# Patient Record
Sex: Female | Born: 1975 | Race: Black or African American | Hispanic: No | Marital: Single | Smoking: Current every day smoker
Health system: Southern US, Community
[De-identification: ages and names within clinical notes are randomized; demographics above are authoritative.]

## PROBLEM LIST (undated history)

## (undated) ENCOUNTER — Inpatient Hospital Stay: Admission: EM | Payer: Self-pay | Source: Home / Self Care

## (undated) DIAGNOSIS — Z789 Other specified health status: Secondary | ICD-10-CM

## (undated) DIAGNOSIS — R51 Headache: Secondary | ICD-10-CM

## (undated) HISTORY — PX: NO PAST SURGERIES: SHX2092

## (undated) HISTORY — DX: Headache: R51

---

## 1999-02-12 ENCOUNTER — Emergency Department (HOSPITAL_COMMUNITY): Admission: EM | Admit: 1999-02-12 | Discharge: 1999-02-12 | Payer: Self-pay | Admitting: Emergency Medicine

## 1999-02-16 DIAGNOSIS — R519 Headache, unspecified: Secondary | ICD-10-CM

## 1999-02-16 HISTORY — DX: Headache, unspecified: R51.9

## 1999-02-18 ENCOUNTER — Ambulatory Visit (HOSPITAL_COMMUNITY): Admission: RE | Admit: 1999-02-18 | Discharge: 1999-02-18 | Payer: Self-pay | Admitting: Emergency Medicine

## 1999-02-18 ENCOUNTER — Encounter: Payer: Self-pay | Admitting: Emergency Medicine

## 1999-04-08 ENCOUNTER — Emergency Department (HOSPITAL_COMMUNITY): Admission: EM | Admit: 1999-04-08 | Discharge: 1999-04-08 | Payer: Self-pay | Admitting: Emergency Medicine

## 1999-06-18 ENCOUNTER — Emergency Department (HOSPITAL_COMMUNITY): Admission: EM | Admit: 1999-06-18 | Discharge: 1999-06-18 | Payer: Self-pay | Admitting: Emergency Medicine

## 1999-11-04 ENCOUNTER — Encounter: Admission: RE | Admit: 1999-11-04 | Discharge: 1999-11-04 | Payer: Self-pay | Admitting: Family Medicine

## 1999-11-04 ENCOUNTER — Encounter: Payer: Self-pay | Admitting: Family Medicine

## 2000-06-17 ENCOUNTER — Encounter: Payer: Self-pay | Admitting: Emergency Medicine

## 2000-06-17 ENCOUNTER — Emergency Department (HOSPITAL_COMMUNITY): Admission: EM | Admit: 2000-06-17 | Discharge: 2000-06-17 | Payer: Self-pay | Admitting: Emergency Medicine

## 2001-07-24 ENCOUNTER — Emergency Department (HOSPITAL_COMMUNITY): Admission: EM | Admit: 2001-07-24 | Discharge: 2001-07-24 | Payer: Self-pay | Admitting: Emergency Medicine

## 2001-09-19 ENCOUNTER — Emergency Department (HOSPITAL_COMMUNITY): Admission: EM | Admit: 2001-09-19 | Discharge: 2001-09-19 | Payer: Self-pay | Admitting: Emergency Medicine

## 2001-12-22 ENCOUNTER — Inpatient Hospital Stay (HOSPITAL_COMMUNITY): Admission: AD | Admit: 2001-12-22 | Discharge: 2001-12-22 | Payer: Self-pay | Admitting: *Deleted

## 2002-08-15 ENCOUNTER — Emergency Department (HOSPITAL_COMMUNITY): Admission: EM | Admit: 2002-08-15 | Discharge: 2002-08-15 | Payer: Self-pay | Admitting: Emergency Medicine

## 2002-11-16 ENCOUNTER — Emergency Department (HOSPITAL_COMMUNITY): Admission: EM | Admit: 2002-11-16 | Discharge: 2002-11-16 | Payer: Self-pay | Admitting: Emergency Medicine

## 2004-01-17 ENCOUNTER — Emergency Department (HOSPITAL_COMMUNITY): Admission: EM | Admit: 2004-01-17 | Discharge: 2004-01-18 | Payer: Self-pay

## 2004-02-29 ENCOUNTER — Emergency Department (HOSPITAL_COMMUNITY): Admission: EM | Admit: 2004-02-29 | Discharge: 2004-03-01 | Payer: Self-pay | Admitting: Emergency Medicine

## 2004-07-07 ENCOUNTER — Emergency Department (HOSPITAL_COMMUNITY): Admission: EM | Admit: 2004-07-07 | Discharge: 2004-07-07 | Payer: Self-pay | Admitting: Emergency Medicine

## 2005-04-24 ENCOUNTER — Emergency Department (HOSPITAL_COMMUNITY): Admission: EM | Admit: 2005-04-24 | Discharge: 2005-04-24 | Payer: Self-pay | Admitting: Emergency Medicine

## 2005-07-19 ENCOUNTER — Emergency Department (HOSPITAL_COMMUNITY): Admission: EM | Admit: 2005-07-19 | Discharge: 2005-07-19 | Payer: Self-pay | Admitting: Emergency Medicine

## 2006-01-04 ENCOUNTER — Emergency Department (HOSPITAL_COMMUNITY): Admission: EM | Admit: 2006-01-04 | Discharge: 2006-01-04 | Payer: Self-pay | Admitting: Emergency Medicine

## 2006-07-31 ENCOUNTER — Emergency Department (HOSPITAL_COMMUNITY): Admission: EM | Admit: 2006-07-31 | Discharge: 2006-07-31 | Payer: Self-pay | Admitting: Emergency Medicine

## 2006-08-25 ENCOUNTER — Emergency Department (HOSPITAL_COMMUNITY): Admission: EM | Admit: 2006-08-25 | Discharge: 2006-08-25 | Payer: Self-pay | Admitting: Emergency Medicine

## 2006-12-01 ENCOUNTER — Emergency Department (HOSPITAL_COMMUNITY): Admission: EM | Admit: 2006-12-01 | Discharge: 2006-12-01 | Payer: Self-pay | Admitting: Emergency Medicine

## 2010-04-15 ENCOUNTER — Emergency Department (HOSPITAL_COMMUNITY)
Admission: EM | Admit: 2010-04-15 | Discharge: 2010-04-15 | Disposition: A | Discharge status: Home / Self Care | Payer: Self-pay | Attending: Emergency Medicine | Admitting: Emergency Medicine

## 2010-04-15 DIAGNOSIS — G5600 Carpal tunnel syndrome, unspecified upper limb: Secondary | ICD-10-CM | POA: Insufficient documentation

## 2010-04-15 DIAGNOSIS — R209 Unspecified disturbances of skin sensation: Secondary | ICD-10-CM | POA: Insufficient documentation

## 2010-04-15 DIAGNOSIS — M25539 Pain in unspecified wrist: Secondary | ICD-10-CM | POA: Insufficient documentation

## 2010-12-19 ENCOUNTER — Encounter (HOSPITAL_COMMUNITY): Payer: Self-pay | Admitting: Obstetrics and Gynecology

## 2010-12-19 ENCOUNTER — Inpatient Hospital Stay (HOSPITAL_COMMUNITY)
Admission: AD | Admit: 2010-12-19 | Discharge: 2010-12-19 | Disposition: A | Discharge status: Home / Self Care | Payer: Self-pay | Source: Ambulatory Visit | Attending: Family Medicine | Admitting: Family Medicine

## 2010-12-19 DIAGNOSIS — R110 Nausea: Secondary | ICD-10-CM | POA: Insufficient documentation

## 2010-12-19 DIAGNOSIS — R109 Unspecified abdominal pain: Secondary | ICD-10-CM | POA: Insufficient documentation

## 2010-12-19 HISTORY — DX: Other specified health status: Z78.9

## 2010-12-19 LAB — URINALYSIS, ROUTINE W REFLEX MICROSCOPIC
Glucose, UA: NEGATIVE mg/dL
Hgb urine dipstick: NEGATIVE
Ketones, ur: 15 mg/dL — AB
Protein, ur: NEGATIVE mg/dL
Urobilinogen, UA: 0.2 mg/dL (ref 0.0–1.0)

## 2010-12-19 LAB — WET PREP, GENITAL
Clue Cells Wet Prep HPF POC: NONE SEEN
Trich, Wet Prep: NONE SEEN
Yeast Wet Prep HPF POC: NONE SEEN

## 2010-12-19 NOTE — ED Provider Notes (Signed)
History     CSN: [id number] Arrival date & time: 12/19/2010  4:03 PM   None     Chief Complaint  Patient presents with  . Nausea  . Dizziness  . Exposure to STD     HPI Teresa Cain is a 35 y.o. female who presents to [facility] for lower abdominal cramping. She is afraid she has trichomonas because she had it before and felt the same way. Current sex partner x 3 months, unprotected sex.  No birth control. Periods have been a little irregular for the past few months.   Past Medical History  Diagnosis Date  . No pertinent past medical history     Past Surgical History  Procedure Date  . No past surgeries     No family history on file.  History  Substance Use Topics  . Smoking status: Current Some Day Smoker -- 1.0 packs/day  . Smokeless tobacco: Not on file  . Alcohol Use: No    OB History    Grav Para Term Preterm Abortions TAB SAB Ect Mult Living   0 0 0 0 0 0 0 0 0 0       Review of Systems  Constitutional: Positive for chills and appetite change. Negative for fever, diaphoresis and fatigue.  HENT: Negative for ear pain, congestion, sore throat, facial swelling, neck pain, neck stiffness, dental problem and sinus pressure.   Eyes: Positive for visual disturbance. Negative for photophobia, pain and discharge.  Respiratory: Negative for cough, chest tightness and wheezing.   Cardiovascular: Negative.   Gastrointestinal: Positive for nausea and abdominal pain. Negative for vomiting, diarrhea, constipation and abdominal distention.  Genitourinary: Positive for urgency, frequency and vaginal discharge. Negative for dysuria, flank pain, vaginal bleeding and difficulty urinating.  Musculoskeletal: Positive for back pain. Negative for myalgias and gait problem.  Skin: Negative for color change and rash.  Neurological: Positive for light-headedness and headaches. Negative for dizziness, speech difficulty, weakness and numbness.  Psychiatric/Behavioral: Negative for  confusion and agitation.    Allergies  Review of patient's allergies indicates no known allergies.  Home Medications  No current outpatient prescriptions on file.  BP 111/59  Pulse 81  Temp(Src) 98.8 F (37.1 C) (Oral)  Resp 18  Ht 5\' 1"  (1.549 m)  Wt 136 lb 9.6 oz (61.961 kg)  BMI 25.81 kg/m2  LMP 12/12/2010  Physical Exam  Nursing note and vitals reviewed. Constitutional: She is oriented to person, place, and time. She appears well-developed and well-nourished. No distress.  HENT:  Head: Normocephalic.  Eyes: EOM are normal.  Neck: Neck supple.  Cardiovascular: Normal rate.   Pulmonary/Chest: Effort normal.  Abdominal: Soft. There is no tenderness.       Unable to reproduce the pain.  Genitourinary:       External without lesions. Mucous discharge vaginal vault. Cervix without lesions, no CMT, no adnexal tenderness.   Musculoskeletal: Normal range of motion.  Neurological: She is alert and oriented to person, place, and time. No cranial nerve deficit.  Skin: Skin is warm and dry.  Psychiatric: She has a normal mood and affect. Her behavior is normal. Judgment and thought content normal.   Results for orders placed during the hospital encounter of 12/19/10 (from the past 24 hour(s))  URINALYSIS, ROUTINE W REFLEX MICROSCOPIC     Status: Abnormal   Collection Time   12/19/10  4:35 PM      Component Value Range   Color, Urine YELLOW  YELLOW    Appearance CLEAR  CLEAR    Specific Gravity, Urine 1.025  1.005 - 1.030    pH 6.0  5.0 - 8.0    Glucose, UA NEGATIVE  NEGATIVE (mg/dL)   Hgb urine dipstick NEGATIVE  NEGATIVE    Bilirubin Urine NEGATIVE  NEGATIVE    Ketones, ur 15 (*) NEGATIVE (mg/dL)   Protein, ur NEGATIVE  NEGATIVE (mg/dL)   Urobilinogen, UA 0.2  0.0 - 1.0 (mg/dL)   Nitrite NEGATIVE  NEGATIVE    Leukocytes, UA NEGATIVE  NEGATIVE   POCT PREGNANCY, URINE     Status: Normal   Collection Time   12/19/10  4:46 PM      Component Value Range   Preg Test, Ur  NEGATIVE    WET PREP, GENITAL     Status: Abnormal   Collection Time   12/19/10  6:15 PM      Component Value Range   Yeast, Wet Prep NONE SEEN  NONE SEEN    Trich, Wet Prep NONE SEEN  NONE SEEN    Clue Cells, Wet Prep NONE SEEN  NONE SEEN    WBC, Wet Prep HPF POC FEW (*) NONE SEEN    Assessment: Nausea   Lower abdominal cramping  Plan:  B.R.A.T diet   Discussed if develops persistent vomiting, high fever or other problems to return.   Follow up with [facility]  Note: Patient states she feels better, just glad to know she does not have trichomonas and is not pregnant.  ED Course  Procedures   MDM          Kerrie Buffalo, NP 12/19/10 1850

## 2010-12-19 NOTE — Progress Notes (Addendum)
Pt reports feeling nauseated  Dizzy and headache x 3 days. Poor appititie vomited yesterday. Pt thinks she may have an STD because she is feeling sick.

## 2010-12-19 NOTE — Progress Notes (Signed)
Pt presents to [facility] with chief complaint of lower abdominal cramping and possible exposure to and STI. Pt requests STI check at this time. Pt is G0

## 2010-12-19 NOTE — ED Provider Notes (Signed)
Chart reviewed and agree with management and plan.  

## 2011-03-13 ENCOUNTER — Encounter (HOSPITAL_COMMUNITY): Payer: Self-pay | Admitting: Emergency Medicine

## 2011-03-13 ENCOUNTER — Emergency Department (HOSPITAL_COMMUNITY)
Admission: EM | Admit: 2011-03-13 | Discharge: 2011-03-13 | Disposition: A | Discharge status: Home / Self Care | Payer: Self-pay | Attending: Emergency Medicine | Admitting: Emergency Medicine

## 2011-03-13 DIAGNOSIS — R220 Localized swelling, mass and lump, head: Secondary | ICD-10-CM | POA: Insufficient documentation

## 2011-03-13 DIAGNOSIS — K047 Periapical abscess without sinus: Secondary | ICD-10-CM | POA: Insufficient documentation

## 2011-03-13 DIAGNOSIS — K089 Disorder of teeth and supporting structures, unspecified: Secondary | ICD-10-CM | POA: Insufficient documentation

## 2011-03-13 DIAGNOSIS — K029 Dental caries, unspecified: Secondary | ICD-10-CM | POA: Insufficient documentation

## 2011-03-13 DIAGNOSIS — R221 Localized swelling, mass and lump, neck: Secondary | ICD-10-CM | POA: Insufficient documentation

## 2011-03-13 DIAGNOSIS — K0889 Other specified disorders of teeth and supporting structures: Secondary | ICD-10-CM

## 2011-03-13 MED ORDER — AMOXICILLIN 500 MG PO CAPS: 500.0000 mg | ORAL_CAPSULE | Freq: Three times a day (TID) | ORAL | Status: AC

## 2011-03-13 MED ORDER — HYDROCODONE-ACETAMINOPHEN 5-500 MG PO TABS: 1.0000 | ORAL_TABLET | Freq: Four times a day (QID) | ORAL | Status: AC | PRN

## 2011-03-13 NOTE — ED Provider Notes (Signed)
History     CSN: [id number]  Arrival date & time 03/13/11  1437   First MD Initiated Contact with Patient 03/13/11 1546      Chief Complaint  Patient presents with  . Dental Pain    (Consider location/radiation/quality/duration/timing/severity/associated sxs/prior treatment) Patient is a 36 y.o. female presenting with tooth pain. The history is provided by the patient.  Dental PainPrimary symptoms do not include headaches, fever or sore throat.  Additional symptoms do not include: trouble swallowing.  pt c/o right lower dental pain, for past 2 weeks. No injury. No local dentist. Constant, dull, severe, non radiating pain worse w eating. No neck pain or stiffness. No facial swelling or redness. No fever or chills.   Past Medical History  Diagnosis Date  . No pertinent past medical history     Past Surgical History  Procedure Date  . No past surgeries     No family history on file.  History  Substance Use Topics  . Smoking status: Current Some Day Smoker -- 1.0 packs/day  . Smokeless tobacco: Not on file  . Alcohol Use: No    OB History    Grav Para Term Preterm Abortions TAB SAB Ect Mult Living   0 0 0 0 0 0 0 0 0 0       Review of Systems  Constitutional: Negative for fever and chills.  HENT: Negative for sore throat and trouble swallowing.   Skin: Negative for rash.  Neurological: Negative for headaches.    Allergies  Review of patient's allergies indicates no known allergies.  Home Medications   Current Outpatient Rx  Name Route Sig Dispense Refill  . IBUPROFEN 200 MG PO TABS Oral Take 200 mg by mouth every 6 (six) hours as needed. For toothache      BP 115/64  Pulse 86  Temp(Src) 98 F (36.7 C) (Oral)  Resp 18  SpO2 100%  LMP 03/07/2011  Physical Exam  Nursing note and vitals reviewed. Constitutional: She appears well-developed and well-nourished. No distress.  HENT:  Head: Atraumatic.  Mouth/Throat: Oropharynx is clear and moist.   Right lower dental decay. Gum swelling and tenderness. No trismus. No sts or tenderness to floor of mouth. No facial redness/swelling.   Eyes: Conjunctivae are normal. No scleral icterus.  Neck: Normal range of motion. Neck supple. No tracheal deviation present.  Cardiovascular: Normal rate.   Pulmonary/Chest: Effort normal. No respiratory distress.  Abdominal: Normal appearance. She exhibits no distension.  Musculoskeletal: She exhibits no edema.  Lymphadenopathy:    She has no cervical adenopathy.  Neurological: She is alert.  Skin: Skin is warm and dry. No rash noted.  Psychiatric: She has a normal mood and affect.    ED Course  Procedures (including critical care time)     MDM  Confirmed nkda. Discussed importance close dental follow up.         Suzi Roots, MD 03/13/11 [phone]

## 2011-03-13 NOTE — ED Notes (Signed)
Pt. Stated, I started having a bad toothache yesteday

## 2012-02-21 ENCOUNTER — Emergency Department (INDEPENDENT_AMBULATORY_CARE_PROVIDER_SITE_OTHER)
Admission: EM | Admit: 2012-02-21 | Discharge: 2012-02-21 | Disposition: A | Discharge status: Home / Self Care | Payer: Self-pay | Source: Home / Self Care | Attending: Family Medicine | Admitting: Family Medicine

## 2012-02-21 ENCOUNTER — Encounter (HOSPITAL_COMMUNITY): Payer: Self-pay

## 2012-02-21 DIAGNOSIS — R109 Unspecified abdominal pain: Secondary | ICD-10-CM

## 2012-02-21 DIAGNOSIS — N644 Mastodynia: Secondary | ICD-10-CM

## 2012-02-21 LAB — POCT URINALYSIS DIP (DEVICE)
Glucose, UA: NEGATIVE mg/dL
Leukocytes, UA: NEGATIVE
Nitrite: NEGATIVE
Urobilinogen, UA: 0.2 mg/dL (ref 0.0–1.0)

## 2012-02-21 LAB — POCT PREGNANCY, URINE: Preg Test, Ur: NEGATIVE

## 2012-02-21 NOTE — ED Provider Notes (Signed)
History     CSN: [id number]  Arrival date & time 02/21/12  1243   First MD Initiated Contact with Patient 02/21/12 1434      Chief Complaint  Patient presents with  . Abdominal Pain    (Consider location/radiation/quality/duration/timing/severity/associated sxs/prior treatment) Patient is a 37 y.o. female presenting with chest pain and abdominal pain. The history is provided by the patient.  Chest Pain The chest pain began 5 - 7 days ago. Chest pain occurs constantly. The severity of the pain is moderate. The quality of the pain is described as aching. The pain does not radiate. Exacerbated by: pressure. Primary symptoms include abdominal pain. Pertinent negatives for primary symptoms include no nausea and no vomiting. She tried nothing for the symptoms. There are no known risk factors. Family history comments: fibrocystic breast    Abdominal Pain The primary symptoms of the illness include abdominal pain. The primary symptoms of the illness do not include nausea, vomiting, diarrhea, dysuria, vaginal discharge or vaginal bleeding. The current episode started 2 days ago. The onset of the illness was gradual.  The abdominal pain is located in the periumbilical region. The abdominal pain does not radiate. The abdominal pain is relieved by nothing.  The patient states that she believes she is currently not pregnant. The patient has not had a change in bowel habit. Symptoms associated with the illness do not include chills, anorexia, heartburn, constipation, urgency, frequency or back pain.    Past Medical History  Diagnosis Date  . No pertinent past medical history     Past Surgical History  Procedure Date  . No past surgeries     History reviewed. No pertinent family history.  History  Substance Use Topics  . Smoking status: Current Some Day Smoker -- 1.0 packs/day  . Smokeless tobacco: Not on file  . Alcohol Use: No    OB History    Grav Para Term Preterm Abortions TAB SAB  Ect Mult Living   0 0 0 0 0 0 0 0 0 0       Review of Systems  Constitutional: Negative for chills.  Cardiovascular: Positive for chest pain.  Gastrointestinal: Positive for abdominal pain. Negative for heartburn, nausea, vomiting, diarrhea, constipation and anorexia.  Genitourinary: Negative for dysuria, urgency, frequency, vaginal bleeding and vaginal discharge.  Musculoskeletal: Negative for back pain.  All other systems reviewed and are negative.    Allergies  Review of patient's allergies indicates no known allergies.  Home Medications   Current Outpatient Rx  Name  Route  Sig  Dispense  Refill  . IBUPROFEN 200 MG PO TABS   Oral   Take 200 mg by mouth every 6 (six) hours as needed. For toothache           BP 116/68  Pulse 74  Temp 98.7 F (37.1 C) (Oral)  Resp 16  SpO2 98%  LMP 01/31/2012  Physical Exam  Nursing note and vitals reviewed. Constitutional: She is oriented to person, place, and time. Vital signs are normal. She appears well-developed and well-nourished. She is active and cooperative.  HENT:  Head: Normocephalic.  Eyes: Conjunctivae normal are normal. Pupils are equal, round, and reactive to light. No scleral icterus.  Neck: Trachea normal. Neck supple.  Cardiovascular: Normal rate, regular rhythm, normal heart sounds and intact distal pulses.   Pulmonary/Chest: Effort normal and breath sounds normal. Right breast exhibits tenderness. Right breast exhibits no inverted nipple, no mass, no nipple discharge and no skin change. Left breast exhibits  tenderness. Left breast exhibits no inverted nipple, no mass, no nipple discharge and no skin change. Breasts are symmetrical.  Abdominal: Normal appearance and bowel sounds are normal. There is no tenderness. There is no CVA tenderness.  Lymphadenopathy:    She has no axillary adenopathy.       Right: No inguinal adenopathy present.       Left: No inguinal adenopathy present.  Neurological: She is alert and  oriented to person, place, and time. No cranial nerve deficit or sensory deficit.  Skin: Skin is warm and dry.  Psychiatric: She has a normal mood and affect. Her speech is normal and behavior is normal. Judgment and thought content normal. Cognition and memory are normal.    ED Course  Procedures (including critical care time)   Labs Reviewed  POCT URINALYSIS DIP (DEVICE)  POCT PREGNANCY, URINE   No results found.   1. Breast tenderness in female   2. Abdominal  pain, other specified site       MDM  Ibuprofen for breast pain, no masses palpated.  Increase fluids, [facility]          Johnsie Kindred, NP 02/21/12 1444  Johnsie Kindred, NP 02/21/12 1446

## 2012-02-21 NOTE — ED Notes (Signed)
Breast pain, pain in chest, HA, abdominal pain ; sexually active w/o BC

## 2012-02-22 NOTE — ED Provider Notes (Signed)
Medical screening examination/treatment/procedure(s) were performed by resident physician or non-physician practitioner and as supervising physician I was immediately available for consultation/collaboration.   KINDL,JAMES DOUGLAS MD.    James D Kindl, MD 02/22/12 1808 

## 2012-05-21 ENCOUNTER — Encounter (HOSPITAL_COMMUNITY): Payer: Self-pay | Admitting: Emergency Medicine

## 2012-05-21 ENCOUNTER — Emergency Department (HOSPITAL_COMMUNITY)
Admission: EM | Admit: 2012-05-21 | Discharge: 2012-05-21 | Disposition: A | Discharge status: Home / Self Care | Payer: Self-pay | Attending: Emergency Medicine | Admitting: Emergency Medicine

## 2012-05-21 DIAGNOSIS — F17200 Nicotine dependence, unspecified, uncomplicated: Secondary | ICD-10-CM | POA: Insufficient documentation

## 2012-05-21 DIAGNOSIS — L0291 Cutaneous abscess, unspecified: Secondary | ICD-10-CM

## 2012-05-21 DIAGNOSIS — IMO0002 Reserved for concepts with insufficient information to code with codable children: Secondary | ICD-10-CM | POA: Insufficient documentation

## 2012-05-21 MED ORDER — SULFAMETHOXAZOLE-TMP DS 800-160 MG PO TABS: 1.0000 | ORAL_TABLET | Freq: Two times a day (BID) | ORAL | Status: DC

## 2012-05-21 MED ORDER — CEPHALEXIN 500 MG PO CAPS: 500.0000 mg | ORAL_CAPSULE | Freq: Two times a day (BID) | ORAL | Status: DC

## 2012-05-21 NOTE — ED Notes (Signed)
Patient is alert and orientedx4.  Patient was explained discharge instructions and they understood them with no questions.   

## 2012-05-21 NOTE — ED Provider Notes (Signed)
History    This chart was scribed for non-physician practitioner working with Vida Roller, MD by Sofie Rower, ED Scribe. This patient was seen in room TR07C/TR07C and the patient's care was started at 6:07PM.   CSN: [id number]  Arrival date & time 05/21/12  1753   First MD Initiated Contact with Patient 05/21/12 1807      Chief Complaint  Patient presents with  . Abscess    (Consider location/radiation/quality/duration/timing/severity/associated sxs/prior treatment) Patient is a 37 y.o. female presenting with abscess. The history is provided by the patient. No language interpreter was used.  Abscess Location:  Shoulder/arm Shoulder/arm abscess location:  L shoulder Abscess quality: painful   Duration:  3 days Progression:  Worsening Pain details:    Quality:  Unable to specify   Severity:  Moderate   Duration:  3 days   Timing:  Constant   Progression:  Worsening Chronicity:  New Relieved by:  Nothing Worsened by:  Nothing tried Ineffective treatments:  Draining/squeezing Associated symptoms: no anorexia, no fatigue, no fever, no headaches, no nausea and no vomiting   Risk factors: no hx of MRSA and no prior abscess     Teresa Cain is a 37 y.o. female , with a hx of cardiac valce replacement, who presents to the Emergency Department complaining of gradual, progressively worsening, abscess, located at the left superior scapular area onset three days ago (05/18/12).  Associated symptoms include chills and swelling located at the left superior scapular area . The pt reports she popped what she believed to be a pimple located at her left superior scapular area, three days ago. The pt informs that the location of the pimple has become progressively more swollen and painful over the past three days, prompting her concern and desire to seek medical evaluation at [facility] this evening.  The pt denies experiencing any fever with regards to the abscess.   The pt is a current some day  smoker, however, she does not drink alcohol.     Past Medical History  Diagnosis Date  . No pertinent past medical history     Past Surgical History  Procedure Laterality Date  . No past surgeries      History reviewed. No pertinent family history.  History  Substance Use Topics  . Smoking status: Current Some Day Smoker -- 1.00 packs/day  . Smokeless tobacco: Not on file  . Alcohol Use: No    OB History   Grav Para Term Preterm Abortions TAB SAB Ect Mult Living   0 0 0 0 0 0 0 0 0 0       Review of Systems  Constitutional: Positive for chills. Negative for fever and fatigue.  Gastrointestinal: Negative for nausea, vomiting and anorexia.  Skin: Positive for wound.  Neurological: Negative for headaches.    Allergies  Review of patient's allergies indicates no known allergies.  Home Medications   Current Outpatient Rx  Name  Route  Sig  Dispense  Refill  . acetaminophen (TYLENOL) 500 MG tablet   Oral   Take 1,000 mg by mouth every 6 (six) hours as needed for pain.         Marland Kitchen ibuprofen (ADVIL,MOTRIN) 200 MG tablet   Oral   Take 400 mg by mouth every 6 (six) hours as needed for pain. For toothache           BP 118/74  Pulse 101  Temp(Src) 98.2 F (36.8 C) (Oral)  Resp 20  SpO2 100%  LMP  04/24/2012  Physical Exam  Nursing note and vitals reviewed. Constitutional: She is oriented to person, place, and time. She appears well-developed and well-nourished. No distress.  HENT:  Head: Normocephalic and atraumatic.  Eyes: EOM are normal.  Neck: Neck supple. No tracheal deviation present.  Cardiovascular: Normal rate.   Pulmonary/Chest: Effort normal. No respiratory distress.  Musculoskeletal: Normal range of motion.  Neurological: She is alert and oriented to person, place, and time.  Skin: Skin is warm and dry. Lesion noted.     Left superior scapular area : Induration area 7 X 6 cm. Deep central fluctuance with overlying induration  Psychiatric: She  has a normal mood and affect. Her behavior is normal.    ED Course  Procedures (including critical care time)  DIAGNOSTIC STUDIES: Oxygen Saturation is 100% on room air, normal by my interpretation.    COORDINATION OF CARE:   7:37 PM- Treatment plan concerning incision and drainage of abscess discussed with patient. Pt agrees with treatment.  INCISION AND DRAINAGE PROCEDURE NOTE: Patient identification was confirmed and verbal consent was obtained. This procedure was performed by Cleda Mccreedy (PA) at 7:42 PM. Site: posterior Left superior scapular area Sterile procedures observed YES Needle size: 27 GAUGE MONOJECT Anesthetic used (type and amt): 2% LIDOCAINE WITH EPI Blade size: 11 Drainage: COPIOUS Complexity: SIMPLE Packing usedNONE Site anesthetized, incision made over site, wound drained and explored loculations, rinsed with copious amounts of normal saline, wound packed with sterile gauze, covered with dry, sterile dressing.  Pt tolerated procedure well without complications.  Instructions for care discussed verbally and pt provided with additional written instructions for homecare and f/u.  7:56 PM- Treatment plan concerning application of antibiotics discussed with patient. Pt agrees with treatment.      Labs Reviewed - No data to display No results found.   1. Abscess       MDM  Patient with skin abscess amenable to incision and drainage.  Abscess was not large enough to warrant packing.  Will d/c to home. DC with keflex and bactrim.       I personally performed the services described in this documentation, which was scribed in my presence. The recorded information has been reviewed and is accurate.    Arthor Captain, PA-C 05/25/12 1247

## 2012-05-21 NOTE — ED Notes (Signed)
Patient said that three days ago she felt a bump on her back.  The patient said she tried to "pop" it and that's when it started getting worse.  The patient said the pain is so severe she decided to come to the ED.

## 2012-05-25 NOTE — ED Provider Notes (Signed)
Medical screening examination/treatment/procedure(s) were performed by non-physician practitioner and as supervising physician I was immediately available for consultation/collaboration.    Henryk Ursin D Lowen Barringer, MD 05/25/12 1643 

## 2013-02-03 ENCOUNTER — Emergency Department (HOSPITAL_COMMUNITY)
Admission: EM | Admit: 2013-02-03 | Discharge: 2013-02-03 | Disposition: A | Discharge status: Home / Self Care | Payer: Medicaid Other | Attending: Emergency Medicine | Admitting: Emergency Medicine

## 2013-02-03 ENCOUNTER — Encounter (HOSPITAL_COMMUNITY): Payer: Self-pay | Admitting: Emergency Medicine

## 2013-02-03 DIAGNOSIS — R110 Nausea: Secondary | ICD-10-CM | POA: Insufficient documentation

## 2013-02-03 DIAGNOSIS — F17200 Nicotine dependence, unspecified, uncomplicated: Secondary | ICD-10-CM | POA: Insufficient documentation

## 2013-02-03 DIAGNOSIS — A5900 Urogenital trichomoniasis, unspecified: Secondary | ICD-10-CM | POA: Insufficient documentation

## 2013-02-03 DIAGNOSIS — A64 Unspecified sexually transmitted disease: Secondary | ICD-10-CM | POA: Insufficient documentation

## 2013-02-03 DIAGNOSIS — Z3202 Encounter for pregnancy test, result negative: Secondary | ICD-10-CM | POA: Insufficient documentation

## 2013-02-03 DIAGNOSIS — A599 Trichomoniasis, unspecified: Secondary | ICD-10-CM

## 2013-02-03 LAB — URINALYSIS, ROUTINE W REFLEX MICROSCOPIC
Glucose, UA: NEGATIVE mg/dL
Ketones, ur: NEGATIVE mg/dL
Leukocytes, UA: NEGATIVE
Nitrite: NEGATIVE
Protein, ur: NEGATIVE mg/dL
Urobilinogen, UA: 0.2 mg/dL (ref 0.0–1.0)

## 2013-02-03 LAB — HIV ANTIBODY (ROUTINE TESTING W REFLEX): HIV: NONREACTIVE

## 2013-02-03 LAB — WET PREP, GENITAL: Yeast Wet Prep HPF POC: NONE SEEN

## 2013-02-03 MED ORDER — LIDOCAINE HCL (PF) 1 % IJ SOLN
INTRAMUSCULAR | Status: AC
  Filled 2013-02-03: qty 5

## 2013-02-03 MED ORDER — CEFTRIAXONE SODIUM 250 MG IJ SOLR
250.0000 mg | Freq: Once | INTRAMUSCULAR | Status: AC
  Administered 2013-02-03: 250 mg via INTRAMUSCULAR
  Filled 2013-02-03: qty 250

## 2013-02-03 MED ORDER — AZITHROMYCIN 250 MG PO TABS
1000.0000 mg | ORAL_TABLET | Freq: Once | ORAL | Status: AC
  Administered 2013-02-03: 1000 mg via ORAL
  Filled 2013-02-03: qty 4

## 2013-02-03 MED ORDER — METRONIDAZOLE 500 MG PO TABS: 500.0000 mg | ORAL_TABLET | Freq: Two times a day (BID) | ORAL | Status: DC

## 2013-02-03 NOTE — ED Provider Notes (Addendum)
CSN: [id number]     Arrival date & time 02/03/13  0902 History   First MD Initiated Contact with Patient 02/03/13 [phone]     Chief Complaint  Patient presents with  . Abdominal Pain  . Urinary Frequency  . Pelvic Pain   (Consider location/radiation/quality/duration/timing/severity/associated sxs/prior Treatment) Patient is a 37 y.o. female presenting with abdominal pain, frequency, and pelvic pain. The history is provided by the patient.  Abdominal Pain Pain location:  Suprapubic Pain quality: cramping   Pain radiates to:  Does not radiate Pain severity:  Moderate Onset quality:  Gradual Duration:  1 week Timing:  Constant Progression:  Worsening Chronicity:  New Context: recent sexual activity   Relieved by:  None tried Worsened by:  Urination Associated symptoms: dysuria, nausea and vaginal discharge   Associated symptoms: no chills, no diarrhea, no fever, no vaginal bleeding and no vomiting   Risk factors comment:  Prior hx of std about 1 year ago Urinary Frequency Associated symptoms include abdominal pain.  Pelvic Pain Associated symptoms include abdominal pain.    Past Medical History  Diagnosis Date  . No pertinent past medical history    Past Surgical History  Procedure Laterality Date  . No past surgeries     History reviewed. No pertinent family history. History  Substance Use Topics  . Smoking status: Current Some Day Smoker -- 1.00 packs/day  . Smokeless tobacco: Not on file  . Alcohol Use: No   OB History   Grav Para Term Preterm Abortions TAB SAB Ect Mult Living   0 0 0 0 0 0 0 0 0 0      Review of Systems  Constitutional: Negative for fever and chills.  Gastrointestinal: Positive for nausea and abdominal pain. Negative for vomiting and diarrhea.  Genitourinary: Positive for dysuria, frequency, vaginal discharge and pelvic pain. Negative for vaginal bleeding.  All other systems reviewed and are negative.    Allergies  Review of patient's  allergies indicates no known allergies.  Home Medications   Current Outpatient Rx  Name  Route  Sig  Dispense  Refill  . acetaminophen (TYLENOL) 500 MG tablet   Oral   Take 1,000 mg by mouth every 6 (six) hours as needed for pain.         Marland Kitchen ibuprofen (ADVIL,MOTRIN) 200 MG tablet   Oral   Take 400 mg by mouth every 6 (six) hours as needed for pain. For toothache          BP 111/69  Pulse 83  Temp(Src) 99.2 F (37.3 C) (Oral)  Resp 14  Ht 5\' 1"  (1.549 m)  Wt 130 lb (58.968 kg)  BMI 24.58 kg/m2  SpO2 99% Physical Exam  Nursing note and vitals reviewed. Constitutional: She is oriented to person, place, and time. She appears well-developed and well-nourished. No distress.  HENT:  Head: Normocephalic and atraumatic.  Mouth/Throat: Oropharynx is clear and moist.  Eyes: Conjunctivae and EOM are normal. Pupils are equal, round, and reactive to light.  Neck: Normal range of motion. Neck supple.  Cardiovascular: Normal rate, regular rhythm and intact distal pulses.   No murmur heard. Pulmonary/Chest: Effort normal and breath sounds normal. No respiratory distress. She has no wheezes. She has no rales.  Abdominal: Soft. She exhibits no distension. There is tenderness in the suprapubic area. There is no rebound and no guarding.  Mild bilateral flank tenderness  Genitourinary: Uterus normal. Cervix exhibits discharge. Cervix exhibits no motion tenderness and no friability. Right adnexum displays no  mass and no tenderness. Left adnexum displays no mass and no tenderness. No tenderness or bleeding around the vagina. Vaginal discharge found.  Musculoskeletal: Normal range of motion. She exhibits no edema and no tenderness.  Neurological: She is alert and oriented to person, place, and time.  Skin: Skin is warm and dry. No rash noted. No erythema.  Psychiatric: She has a normal mood and affect. Her behavior is normal.    ED Course  Procedures (including critical care time) Labs  Review Labs Reviewed  WET PREP, GENITAL - Abnormal; Notable for the following:    Trich, Wet Prep MANY (*)    Clue Cells Wet Prep HPF POC FEW (*)    WBC, Wet Prep HPF POC FEW (*)    All other components within normal limits  GC/CHLAMYDIA PROBE AMP  URINALYSIS, ROUTINE W REFLEX MICROSCOPIC  RPR  HIV ANTIBODY (ROUTINE TESTING)  POCT PREGNANCY, URINE   Imaging Review No results found.  EKG Interpretation   None       MDM   1. Trichomonas   2. Sexually transmitted disease    Patient here with pelvic cramping, vaginal discharge and dysuria for the last one week. She is sexually active and does not use protection. She is sexually active with one partner and is not aware of her partner having any symptoms at this time. Last menstrual period was earlier this week and was normal. UPT is negative.  Patient currently has no symptoms consistent with pyelonephritis however concern for UTI versus STD.  STD panel including syphilis and HIV sent.  11:26 AM Wet prep with trichomonas.  Pt treated with rocephin, azithro and flagyl.  Gwyneth Sprout, MD 02/03/13 1126  Gwyneth Sprout, MD 02/03/13 [phone]

## 2013-02-03 NOTE — ED Notes (Signed)
PT ambulated with baseline gait; VSS; A&Ox3; no signs of distress; respirations even and unlabored; skin warm and dry; no questions upon discharge.  

## 2013-02-03 NOTE — ED Notes (Signed)
Pt here for abd pain and urinary symptoms, reports abd cramping and pelvic pain, pt reporst she is sexually active. Patient sts she has discharge when she urinates

## 2013-02-05 LAB — GC/CHLAMYDIA PROBE AMP: GC Probe RNA: NEGATIVE

## 2013-06-11 ENCOUNTER — Emergency Department (HOSPITAL_COMMUNITY)
Admission: EM | Admit: 2013-06-11 | Discharge: 2013-06-11 | Disposition: A | Discharge status: Home / Self Care | Payer: Medicaid Other | Attending: Emergency Medicine | Admitting: Emergency Medicine

## 2013-06-11 ENCOUNTER — Emergency Department (HOSPITAL_COMMUNITY): Payer: Medicaid Other

## 2013-06-11 ENCOUNTER — Encounter (HOSPITAL_COMMUNITY): Payer: Self-pay | Admitting: Emergency Medicine

## 2013-06-11 DIAGNOSIS — Z331 Pregnant state, incidental: Secondary | ICD-10-CM

## 2013-06-11 DIAGNOSIS — O9989 Other specified diseases and conditions complicating pregnancy, childbirth and the puerperium: Secondary | ICD-10-CM | POA: Insufficient documentation

## 2013-06-11 DIAGNOSIS — N949 Unspecified condition associated with female genital organs and menstrual cycle: Secondary | ICD-10-CM | POA: Insufficient documentation

## 2013-06-11 DIAGNOSIS — Z3490 Encounter for supervision of normal pregnancy, unspecified, unspecified trimester: Secondary | ICD-10-CM

## 2013-06-11 DIAGNOSIS — O99330 Smoking (tobacco) complicating pregnancy, unspecified trimester: Secondary | ICD-10-CM | POA: Insufficient documentation

## 2013-06-11 LAB — COMPREHENSIVE METABOLIC PANEL
ALBUMIN: 3.6 g/dL (ref 3.5–5.2)
ALT: 15 U/L (ref 0–35)
AST: 20 U/L (ref 0–37)
Alkaline Phosphatase: 41 U/L (ref 39–117)
BUN: 6 mg/dL (ref 6–23)
CALCIUM: 9.3 mg/dL (ref 8.4–10.5)
CO2: 23 mEq/L (ref 19–32)
CREATININE: 0.69 mg/dL (ref 0.50–1.10)
Chloride: 102 mEq/L (ref 96–112)
GFR calc Af Amer: >90 mL/min (ref >90)
GFR calc non Af Amer: >90 mL/min (ref >90)
Glucose, Bld: 82 mg/dL (ref 70–99)
Potassium: 3.6 mEq/L — ABNORMAL LOW (ref 3.7–5.3)
Sodium: 137 mEq/L (ref 137–147)
Total Bilirubin: <0.2 mg/dL — ABNORMAL LOW (ref 0.3–1.2)
Total Protein: 7.1 g/dL (ref 6.0–8.3)

## 2013-06-11 LAB — CBC WITH DIFFERENTIAL/PLATELET
BASOS PCT: 0 % (ref 0–1)
Basophils Absolute: 0 10*3/uL (ref 0.0–0.1)
EOS PCT: 3 % (ref 0–5)
Eosinophils Absolute: 0.2 10*3/uL (ref 0.0–0.7)
HEMATOCRIT: 35.3 % — AB (ref 36.0–46.0)
Hemoglobin: 12.4 g/dL (ref 12.0–15.0)
LYMPHS PCT: 30 % (ref 12–46)
Lymphs Abs: 2.3 10*3/uL (ref 0.7–4.0)
MCH: 32.3 pg (ref 26.0–34.0)
MCHC: 35.1 g/dL (ref 30.0–36.0)
MCV: 91.9 fL (ref 78.0–100.0)
MONO ABS: 0.7 10*3/uL (ref 0.1–1.0)
Monocytes Relative: 9 % (ref 3–12)
NEUTROS ABS: 4.3 10*3/uL (ref 1.7–7.7)
Neutrophils Relative %: 58 % (ref 43–77)
Platelets: 355 10*3/uL (ref 150–400)
RBC: 3.84 MIL/uL — ABNORMAL LOW (ref 3.87–5.11)
RDW: 13.4 % (ref 11.5–15.5)
WBC: 7.5 10*3/uL (ref 4.0–10.5)

## 2013-06-11 LAB — URINALYSIS, ROUTINE W REFLEX MICROSCOPIC
Bilirubin Urine: NEGATIVE
GLUCOSE, UA: NEGATIVE mg/dL
HGB URINE DIPSTICK: NEGATIVE
Ketones, ur: NEGATIVE mg/dL
LEUKOCYTES UA: NEGATIVE
Nitrite: NEGATIVE
PH: 8.5 — AB (ref 5.0–8.0)
Protein, ur: NEGATIVE mg/dL
SPECIFIC GRAVITY, URINE: 1.022 (ref 1.005–1.030)
Urobilinogen, UA: 0.2 mg/dL (ref 0.0–1.0)

## 2013-06-11 LAB — PREGNANCY, URINE: Preg Test, Ur: POSITIVE — AB

## 2013-06-11 LAB — ABO/RH: ABO/RH(D): B POS

## 2013-06-11 LAB — WET PREP, GENITAL
Trich, Wet Prep: NONE SEEN
WBC, Wet Prep HPF POC: NONE SEEN
Yeast Wet Prep HPF POC: NONE SEEN

## 2013-06-11 LAB — HCG, QUANTITATIVE, PREGNANCY: HCG, BETA CHAIN, QUANT, S: 85172 m[IU]/mL — AB (ref <5)

## 2013-06-11 MED ORDER — ONDANSETRON 4 MG PO TBDP
4.0000 mg | ORAL_TABLET | Freq: Once | ORAL | Status: AC
  Administered 2013-06-11: 4 mg via ORAL
  Filled 2013-06-11: qty 1

## 2013-06-11 MED ORDER — PRENATAL VITAMINS 0.8 MG PO TABS: 1.0000 | ORAL_TABLET | Freq: Every day | ORAL | Status: DC

## 2013-06-11 MED ORDER — ONDANSETRON HCL 4 MG PO TABS: 4.0000 mg | ORAL_TABLET | Freq: Four times a day (QID) | ORAL | Status: DC

## 2013-06-11 NOTE — ED Provider Notes (Signed)
CSN: [id number]     Arrival date & time 06/11/13  1126 History   First MD Initiated Contact with Patient 06/11/13 1822     Chief Complaint  Patient presents with  . Abdominal Pain     (Consider location/radiation/quality/duration/timing/severity/associated sxs/prior Treatment) Patient is a 38 y.o. female presenting with abdominal pain. The history is provided by the patient. No language interpreter was used.  Abdominal Pain Pain location:  Suprapubic Pain quality: aching and cramping   Associated symptoms: no fever, no nausea, no vaginal bleeding, no vaginal discharge and no vomiting   Associated symptoms comment:  She presents for evaluation of lower abdominal cramping without vaginal bleeding or discharge. She states she recently had a home pregnancy test that was positive and became concerned when she started having pain. She is a G1P0. No nausea, vomiting, fever or dysuria.    Past Medical History  Diagnosis Date  . No pertinent past medical history    Past Surgical History  Procedure Laterality Date  . No past surgeries     No family history on file. History  Substance Use Topics  . Smoking status: Current Some Day Smoker -- 1.00 packs/day  . Smokeless tobacco: Not on file  . Alcohol Use: No   OB History   Grav Para Term Preterm Abortions TAB SAB Ect Mult Living   0 0 0 0 0 0 0 0 0 0      Review of Systems  Constitutional: Negative for fever.  Respiratory: Negative.   Cardiovascular: Negative.   Gastrointestinal: Positive for abdominal pain. Negative for nausea and vomiting.  Genitourinary: Positive for pelvic pain. Negative for vaginal bleeding and vaginal discharge.  Neurological: Negative.       Allergies  Shellfish allergy  Home Medications   Prior to Admission medications   Not on File   BP 119/71  Pulse 71  Temp(Src) 98.3 F (36.8 C) (Oral)  Resp 18  Ht 5\' 1"  (1.549 m)  Wt 132 lb 3 oz (59.96 kg)  BMI 24.99 kg/m2  SpO2 100%  LMP  04/18/2013 Physical Exam  Constitutional: She is oriented to person, place, and time. She appears well-developed and well-nourished.  HENT:  Head: Normocephalic.  Neck: Normal range of motion. Neck supple.  Cardiovascular: Normal rate and regular rhythm.   Pulmonary/Chest: Effort normal and breath sounds normal.  Abdominal: Soft. Bowel sounds are normal. There is tenderness. There is no rebound and no guarding.  Mild lower abdominal tenderness.   Genitourinary: Vagina normal and uterus normal. No vaginal discharge found.  No adnexal mass or tenderness. Cervix unremarkable in appearance. No CMT.  Musculoskeletal: Normal range of motion.  Neurological: She is alert and oriented to person, place, and time.  Skin: Skin is warm and dry. No rash noted.  Psychiatric: She has a normal mood and affect.    ED Course  Procedures (including critical care time) Labs Review Labs Reviewed  WET PREP, GENITAL - Abnormal; Notable for the following:    Clue Cells Wet Prep HPF POC FEW (*)    All other components within normal limits  CBC WITH DIFFERENTIAL - Abnormal; Notable for the following:    RBC 3.84 (*)    HCT 35.3 (*)    All other components within normal limits  COMPREHENSIVE METABOLIC PANEL - Abnormal; Notable for the following:    Potassium 3.6 (*)    Total Bilirubin <0.2 (*)    All other components within normal limits  PREGNANCY, URINE - Abnormal; Notable for the  following:    Preg Test, Ur POSITIVE (*)    All other components within normal limits  URINALYSIS, ROUTINE W REFLEX MICROSCOPIC - Abnormal; Notable for the following:    pH 8.5 (*)    All other components within normal limits  HCG, QUANTITATIVE, PREGNANCY - Abnormal; Notable for the following:    hCG, Beta Chain, Quant, S 85172 (*)    All other components within normal limits  GC/CHLAMYDIA PROBE AMP  HIV ANTIBODY (ROUTINE TESTING)  ABO/RH    Imaging Review US Ob Comp Less 14 Wks  06/11/2013   CLINICAL DATA:  Positive  pregnancy test, abdominal pain.  EXAM: OBSTETRIC <14 WK [place] AND TRANSVAGINAL OB US  TECHNIQUE: Both transabdominal and transvaginal ultrasound examinations were performed for complete evaluation of the gestation as well as the maternal uterus, adnexal regions, and pelvic cul-de-sac. Transvaginal technique was performed to assess early pregnancy.  COMPARISON:  None.  FINDINGS: Intrauterine gestational sac: Visualized/normal in shape.  Yolk sac:  Present  Embryo:  Present  Cardiac Activity: Present  Heart Rate:  175 bpm  CRL:   20.6  mm   8 w 5 d                  [place] EDC: January 16, 2014  Maternal uterus/adnexae: The right ovary is normal in appearance. The left ovary contains a hyperechoic focus measuring 2.2 x 1.5 x 1.8 cm. Two uterine fibroids are demonstrated measuring up to 1.6 cm in diameter.  IMPRESSION: 1. There is an early 86 with estimated gestational age of [redacted] weeks 5 days corresponding to an estimated date of confinement of January 16, 2014. No subchorionic hemorrhage is demonstrated. 2. A hyperechoic focus measuring 2.2 cm in greatest dimension in the left ovary may reflect a hemorrhagic corpus luteum cyst. The right ovary is normal in appearance. Two uterine fibroids are demonstrated.   Electronically Signed   By: David  Martinique   On: 06/11/2013 20:44   US Ob Transvaginal  06/11/2013   CLINICAL DATA:  Positive pregnancy test, abdominal pain.  EXAM: OBSTETRIC <14 WK [place] AND TRANSVAGINAL OB US  TECHNIQUE: Both transabdominal and transvaginal ultrasound examinations were performed for complete evaluation of the gestation as well as the maternal uterus, adnexal regions, and pelvic cul-de-sac. Transvaginal technique was performed to assess early pregnancy.  COMPARISON:  None.  FINDINGS: Intrauterine gestational sac: Visualized/normal in shape.  Yolk sac:  Present  Embryo:  Present  Cardiac Activity: Present  Heart Rate:  175 bpm  CRL:   20.6  mm   8 w 5 d                  [place] EDC: January 16, 2014  Maternal  uterus/adnexae: The right ovary is normal in appearance. The left ovary contains a hyperechoic focus measuring 2.2 x 1.5 x 1.8 cm. Two uterine fibroids are demonstrated measuring up to 1.6 cm in diameter.  IMPRESSION: 1. There is an early 18 with estimated gestational age of [redacted] weeks 5 days corresponding to an estimated date of confinement of January 16, 2014. No subchorionic hemorrhage is demonstrated. 2. A hyperechoic focus measuring 2.2 cm in greatest dimension in the left ovary may reflect a hemorrhagic corpus luteum cyst. The right ovary is normal in appearance. Two uterine fibroids are demonstrated.   Electronically Signed   By: David  [place]   On: 06/11/2013 20:44     EKG Interpretation None      MDM   Final diagnoses:  None    1. Early IUP  IUP by ultrasound without complication. She is comfortable. Tolerating PO's without nausea but requesting home Zofran. Encouraged to establish with OB for prenatal care.     Dewaine Oats, PA-C 06/11/13 2112

## 2013-06-11 NOTE — ED Provider Notes (Signed)
Medical screening examination/treatment/procedure(s) were performed by non-physician practitioner and as supervising physician I was immediately available for consultation/collaboration.   EKG Interpretation None       Tayley Mudrick K Linker, MD 06/11/13 2157 

## 2013-06-11 NOTE — ED Notes (Signed)
Pt reports missed period. Last period 04/18/13. Took positive pregnancy test yesterday. Also lower abdominal pain, for several weeks. Denies vaginal bleeding or discharge is also dizzy. Pt is a x 4. Ambulatory. In NAD

## 2013-06-11 NOTE — Discharge Instructions (Signed)

## 2013-06-11 NOTE — ED Notes (Signed)
Pt updated on wait time. In NAD. Apologized for delay.

## 2013-06-11 NOTE — ED Notes (Signed)
Patient transported to Ultrasound 

## 2013-06-12 LAB — GC/CHLAMYDIA PROBE AMP
CT Probe RNA: NEGATIVE
GC Probe RNA: NEGATIVE

## 2013-06-12 LAB — HIV ANTIBODY (ROUTINE TESTING W REFLEX): HIV 1&2 Ab, 4th Generation: NONREACTIVE

## 2013-07-31 ENCOUNTER — Encounter: Payer: Self-pay | Admitting: Obstetrics and Gynecology

## 2013-08-01 ENCOUNTER — Other Ambulatory Visit (HOSPITAL_COMMUNITY)
Admission: RE | Admit: 2013-08-01 | Discharge: 2013-08-01 | Disposition: A | Discharge status: Home / Self Care | Payer: Medicaid Other | Source: Ambulatory Visit | Attending: Obstetrics and Gynecology | Admitting: Obstetrics and Gynecology

## 2013-08-01 ENCOUNTER — Encounter: Payer: Self-pay | Admitting: Obstetrics and Gynecology

## 2013-08-01 ENCOUNTER — Ambulatory Visit (INDEPENDENT_AMBULATORY_CARE_PROVIDER_SITE_OTHER): Payer: Medicaid Other | Admitting: Obstetrics and Gynecology

## 2013-08-01 VITALS — BP 111/66 | HR 87 | Temp 98.6°F | Wt 131.0 lb

## 2013-08-01 DIAGNOSIS — O09529 Supervision of elderly multigravida, unspecified trimester: Secondary | ICD-10-CM

## 2013-08-01 DIAGNOSIS — F17200 Nicotine dependence, unspecified, uncomplicated: Secondary | ICD-10-CM | POA: Insufficient documentation

## 2013-08-01 DIAGNOSIS — O0990 Supervision of high risk pregnancy, unspecified, unspecified trimester: Secondary | ICD-10-CM | POA: Insufficient documentation

## 2013-08-01 DIAGNOSIS — Z1151 Encounter for screening for human papillomavirus (HPV): Secondary | ICD-10-CM | POA: Insufficient documentation

## 2013-08-01 DIAGNOSIS — Z01419 Encounter for gynecological examination (general) (routine) without abnormal findings: Secondary | ICD-10-CM | POA: Insufficient documentation

## 2013-08-01 DIAGNOSIS — Z3402 Encounter for supervision of normal first pregnancy, second trimester: Secondary | ICD-10-CM

## 2013-08-01 DIAGNOSIS — Z3400 Encounter for supervision of normal first pregnancy, unspecified trimester: Secondary | ICD-10-CM

## 2013-08-01 DIAGNOSIS — D259 Leiomyoma of uterus, unspecified: Secondary | ICD-10-CM | POA: Insufficient documentation

## 2013-08-01 DIAGNOSIS — Z113 Encounter for screening for infections with a predominantly sexual mode of transmission: Secondary | ICD-10-CM | POA: Insufficient documentation

## 2013-08-01 DIAGNOSIS — O3410 Maternal care for benign tumor of corpus uteri, unspecified trimester: Secondary | ICD-10-CM

## 2013-08-01 NOTE — Patient Instructions (Addendum)
Fibroids Fibroids are lumps (tumors) that can occur any place in a woman's body. These lumps are not cancerous. Fibroids vary in size, weight, and where they grow. HOME CARE  Do not take aspirin.  Write down the number of pads or tampons you use during your period. Tell your doctor. This can help determine the best treatment for you. GET HELP RIGHT AWAY IF:  You have pain in your lower belly (abdomen) that is not helped with medicine.  You have cramps that are not helped with medicine.  You have more bleeding between or during your period.  You feel lightheaded or pass out (faint).  Your lower belly pain gets worse. MAKE SURE YOU:  Understand these instructions.  Will watch your condition.  Will get help right away if you are not doing well or get worse. Document Released: 03/06/2010 Document Revised: 04/26/2011 Document Reviewed: 03/06/2010 [facility] Patient Information 2014 [place], [place]. Second Trimester of Pregnancy The second trimester is from week 13 through week 28, months 4 through 6. The second trimester is often a time when you feel your best. Your body has also adjusted to being pregnant, and you begin to feel better physically. Usually, morning sickness has lessened or quit completely, you may have more energy, and you may have an increase in appetite. The second trimester is also a time when the fetus is growing rapidly. At the end of the sixth month, the fetus is about 9 inches long and weighs about 1 pounds. You will likely begin to feel the baby move (quickening) between 18 and 20 weeks of the pregnancy. BODY CHANGES Your body goes through many changes during pregnancy. The changes vary from woman to woman.   Your weight will continue to increase. You will notice your lower abdomen bulging out.  You may begin to get stretch marks on your hips, abdomen, and breasts.  You may develop headaches that can be relieved by medicines approved by your health care  provider.  You may urinate more often because the fetus is pressing on your bladder.  You may develop or continue to have heartburn as a result of your pregnancy.  You may develop constipation because certain hormones are causing the muscles that push waste through your intestines to slow down.  You may develop hemorrhoids or swollen, bulging veins (varicose veins).  You may have back pain because of the weight gain and pregnancy hormones relaxing your joints between the bones in your pelvis and as a result of a shift in weight and the muscles that support your balance.  Your breasts will continue to grow and be tender.  Your gums may bleed and may be sensitive to brushing and flossing.  Dark spots or blotches (chloasma, mask of pregnancy) may develop on your face. This will likely fade after the baby is born.  A dark line from your belly button to the pubic area (linea nigra) may appear. This will likely fade after the baby is born.  You may have changes in your hair. These can include thickening of your hair, rapid growth, and changes in texture. Some women also have hair loss during or after pregnancy, or hair that feels dry or thin. Your hair will most likely return to normal after your baby is born. WHAT TO EXPECT AT YOUR PRENATAL VISITS During a routine prenatal visit:  You will be weighed to make sure you and the fetus are growing normally.  Your blood pressure will be taken.  Your abdomen will be  measured to track your baby's growth.  The fetal heartbeat will be listened to.  Any test results from the previous visit will be discussed. Your health care provider may ask you:  How you are feeling.  If you are feeling the baby move.  If you have had any abnormal symptoms, such as leaking fluid, bleeding, severe headaches, or abdominal cramping.  If you have any questions. Other tests that may be performed during your second trimester include:  Blood tests that check  for:  Low iron levels (anemia).  Gestational diabetes (between 24 and 28 weeks).  Rh antibodies.  Urine tests to check for infections, diabetes, or protein in the urine.  An ultrasound to confirm the proper growth and development of the baby.  An amniocentesis to check for possible genetic problems.  Fetal screens for spina bifida and Down syndrome. HOME CARE INSTRUCTIONS   Avoid all smoking, herbs, alcohol, and unprescribed drugs. These chemicals affect the formation and growth of the baby.  Follow your health care provider's instructions regarding medicine use. There are medicines that are either safe or unsafe to take during pregnancy.  Exercise only as directed by your health care provider. Experiencing uterine cramps is a good sign to stop exercising.  Continue to eat regular, healthy meals.  Wear a good support bra for breast tenderness.  Do not use hot tubs, steam rooms, or saunas.  Wear your seat belt at all times when driving.  Avoid raw meat, uncooked cheese, cat litter boxes, and soil used by cats. These carry germs that can cause birth defects in the baby.  Take your prenatal vitamins.  Try taking a stool softener (if your health care provider approves) if you develop constipation. Eat more high-fiber foods, such as fresh vegetables or fruit and whole grains. Drink plenty of fluids to keep your urine clear or pale yellow.  Take warm sitz baths to soothe any pain or discomfort caused by hemorrhoids. Use hemorrhoid cream if your health care provider approves.  If you develop varicose veins, wear support hose. Elevate your feet for 15 minutes, 3-4 times a day. Limit salt in your diet.  Avoid heavy lifting, wear low heel shoes, and practice good posture.  Rest with your legs elevated if you have leg cramps or low back pain.  Visit your dentist if you have not gone yet during your pregnancy. Use a soft toothbrush to brush your teeth and be gentle when you  floss.  A sexual relationship may be continued unless your health care provider directs you otherwise.  Continue to go to all your prenatal visits as directed by your health care provider. SEEK MEDICAL CARE IF:   You have dizziness.  You have mild pelvic cramps, pelvic pressure, or nagging pain in the abdominal area.  You have persistent nausea, vomiting, or diarrhea.  You have a bad smelling vaginal discharge.  You have pain with urination. SEEK IMMEDIATE MEDICAL CARE IF:   You have a fever.  You are leaking fluid from your vagina.  You have spotting or bleeding from your vagina.  You have severe abdominal cramping or pain.  You have rapid weight gain or loss.  You have shortness of breath with chest pain.  You notice sudden or extreme swelling of your face, hands, ankles, feet, or legs.  You have not felt your baby move in over an hour.  You have severe headaches that do not go away with medicine.  You have vision changes. Document Released: 01/26/2001 Document  Revised: 02/06/2013 Document Reviewed: 04/04/2012 [facility] Patient Information 2015 [place], [place]. This information is not intended to replace advice given to you by your health care provider. Make sure you discuss any questions you have with your health care provider. Smoking Cessation Quitting smoking is important to your health and has many advantages. However, it is not always easy to quit since nicotine is a very addictive drug. Often times, people try 3 times or more before being able to quit. This document explains the best ways for you to prepare to quit smoking. Quitting takes hard work and a lot of effort, but you can do it. ADVANTAGES OF QUITTING SMOKING  You will live longer, feel better, and live better.  Your body will feel the impact of quitting smoking almost immediately.  Within 20 minutes, blood pressure decreases. Your pulse returns to its normal level.  After 8 hours, carbon monoxide  levels in the blood return to normal. Your oxygen level increases.  After 24 hours, the chance of having a heart attack starts to decrease. Your breath, hair, and body stop smelling like smoke.  After 48 hours, damaged nerve endings begin to recover. Your sense of taste and smell improve.  After 72 hours, the body is virtually free of nicotine. Your bronchial tubes relax and breathing becomes easier.  After 2 to 12 weeks, lungs can hold more air. Exercise becomes easier and circulation improves.  The risk of having a heart attack, stroke, cancer, or lung disease is greatly reduced.  After 1 year, the risk of coronary heart disease is cut in half.  After 5 years, the risk of stroke falls to the same as a nonsmoker.  After 10 years, the risk of lung cancer is cut in half and the risk of other cancers decreases significantly.  After 15 years, the risk of coronary heart disease drops, usually to the level of a nonsmoker.  If you are pregnant, quitting smoking will improve your chances of having a healthy baby.  The people you live with, especially any children, will be healthier.  You will have extra money to spend on things other than cigarettes. QUESTIONS TO THINK ABOUT BEFORE ATTEMPTING TO QUIT You may want to talk about your answers with your caregiver.  Why do you want to quit?  If you tried to quit in the past, what helped and what did not?  What will be the most difficult situations for you after you quit? How will you plan to handle them?  Who can help you through the tough times? Your family? Friends? A caregiver?  What pleasures do you get from smoking? What ways can you still get pleasure if you quit? Here are some questions to ask your caregiver:  How can you help me to be successful at quitting?  What medicine do you think would be best for me and how should I take it?  What should I do if I need more help?  What is smoking withdrawal like? How can I get  information on withdrawal? GET READY  Set a quit date.  Change your environment by getting rid of all cigarettes, ashtrays, matches, and lighters in your home, car, or work. Do not let people smoke in your home.  Review your past attempts to quit. Think about what worked and what did not. GET SUPPORT AND ENCOURAGEMENT You have a better chance of being successful if you have help. You can get support in many ways.  Tell your family, friends, and co-workers  that you are going to quit and need their support. Ask them not to smoke around you.  Get individual, group, or telephone counseling and support. Programs are available at [employer] and health centers. Call your local health department for information about programs in your area.  Spiritual beliefs and practices may help some smokers quit.  Download a "quit meter" on your computer to keep track of quit statistics, such as how long you have gone without smoking, cigarettes not smoked, and money saved.  Get a self-help book about quitting smoking and staying off of tobacco. [place] yourself from urges to smoke. Talk to someone, go for a walk, or occupy your time with a task.  Change your normal routine. Take a different route to work. Drink tea instead of coffee. Eat breakfast in a different place.  Reduce your stress. Take a hot bath, exercise, or read a book.  Plan something enjoyable to do every day. Reward yourself for not smoking.  Explore interactive web-based programs that specialize in helping you quit. GET MEDICINE AND USE IT CORRECTLY Medicines can help you stop smoking and decrease the urge to smoke. Combining medicine with the above behavioral methods and support can greatly increase your chances of successfully quitting smoking.  Nicotine replacement therapy helps deliver nicotine to your body without the negative effects and risks of smoking. Nicotine replacement therapy includes  nicotine gum, lozenges, inhalers, nasal sprays, and skin patches. Some may be available over-the-counter and others require a prescription.  Antidepressant medicine helps people abstain from smoking, but how this works is unknown. This medicine is available by prescription.  Nicotinic receptor partial agonist medicine simulates the effect of nicotine in your brain. This medicine is available by prescription. Ask your caregiver for advice about which medicines to use and how to use them based on your health history. Your caregiver will tell you what side effects to look out for if you choose to be on a medicine or therapy. Carefully read the information on the package. Do not use any other product containing nicotine while using a nicotine replacement product.  RELAPSE OR DIFFICULT SITUATIONS Most relapses occur within the first 3 months after quitting. Do not be discouraged if you start smoking again. Remember, most people try several times before finally quitting. You may have symptoms of withdrawal because your body is used to nicotine. You may crave cigarettes, be irritable, feel very hungry, cough often, get headaches, or have difficulty concentrating. The withdrawal symptoms are only temporary. They are strongest when you first quit, but they will go away within 10-14 days. To reduce the chances of relapse, try to:  Avoid drinking alcohol. Drinking lowers your chances of successfully quitting.  Reduce the amount of caffeine you consume. Once you quit smoking, the amount of caffeine in your body increases and can give you symptoms, such as a rapid heartbeat, sweating, and anxiety.  Avoid smokers because they can make you want to smoke.  Do not let weight gain distract you. Many smokers will gain weight when they quit, usually less than 10 pounds. Eat a healthy diet and stay active. You can always lose the weight gained after you quit.  Find ways to improve your mood other than smoking. FOR  MORE INFORMATION  www.smokefree.gov  Document Released: 01/26/2001 Document Revised: 08/03/2011 Document Reviewed: 05/13/2011 [facility] Patient Information 2015 [place], [place]. This information is not intended to replace advice given to you by your health care provider. Make sure  you discuss any questions you have with your health care provider.

## 2013-08-01 NOTE — Progress Notes (Signed)
   Subjective:    Teresa BURGANDY HACKWORTH is a G1P0000 [redacted]w[redacted]d being seen today for her first obstetrical visit.  Her obstetrical history is significant for advanced maternal age and fibroids.. Patient does intend to breast feed. Pregnancy history fully reviewed.  Patient reports headache and nausea earlier in the pregnancy> no sx now. Excited about the pregnancy. Looking for a job.  States when she gets stress she has palpitations occasionally.   Filed Vitals:   08/01/13 0928  BP: 111/66  Pulse: 87  Temp: 98.6 F (37 C)  Weight: 131 lb (59.421 kg)    HISTORY: OB History  Gravida Para Term Preterm AB SAB TAB Ectopic Multiple Living  1 0 0 0 0 0 0 0 0 0     # Outcome Date GA Lbr Len/2nd Weight Sex Delivery Anes PTL Lv  1 CUR              Past Medical History  Diagnosis Date  . No pertinent past medical history   . Head ache 2001    Spinal tap procedure was done to r/o meningitis   Past Surgical History  Procedure Laterality Date  . No past surgeries     Family History  Problem Relation Age of Onset  . Diabetes Mother   . Hypertension Mother   . Diabetes Maternal Grandmother      Exam    Uterus:     Pelvic Exam:    Perineum: Normal Perineum   Vulva: normal   Vagina:  normal mucosa, normal discharge       Cervix: no lesions, nulliparous appearance and L/C   Adnexa: no mass, fullness, tenderness   Bony Pelvis: average  System: Breast:  normal appearance, no masses or tenderness   Skin: normal coloration and turgor, no rashes    Neurologic: oriented, grossly non-focal   Extremities: normal strength, tone, and muscle mass   HEENT PERRLA and thyroid without masses   Mouth/Teeth mucous membranes moist, pharynx normal without lesions and dental hygiene good   Neck supple and no masses   Cardiovascular: regular rate and rhythm, no murmurs or gallops over 1 min auscultation   Respiratory:  appears well, vitals normal, no respiratory distress, acyanotic, normal RR, ear and  throat exam is normal, neck free of mass or lymphadenopathy, chest clear, no wheezing, crepitations, rhonchi, normal symmetric air entry   Abdomen: soft, non-tender; bowel sounds normal; no masses,  no organomegaly DT 160   Urinary: urethral meatus normal      Assessment:    Pregnancy: G1P0000 Patient Active Problem List   Diagnosis Date Noted  . AMA (advanced maternal age) multigravida 35+ 08/01/2013  . Supervision of normal first pregnancy in second trimester 08/01/2013  . Uterine fibroids affecting pregnancy 08/01/2013        Plan:     Initial labs drawn. 1 hr OGTT Prenatal vitamins. Problem list reviewed and updated. Genetic Screening discussed Quad Screen: requested.  Ultrasound discussed; fetal survey: ordered.  Follow up in 4 weeks. Advised to come to [facility] if any palpitations.  Schedule genetic counseling 50% of 30 min visit spent on counseling and coordination of care.     Glendale Wherry 08/01/2013

## 2013-08-01 NOTE — Addendum Note (Signed)
Addended by: Rutherford Nail E on: 08/01/2013 04:50 PM   Modules accepted: Orders

## 2013-08-01 NOTE — Progress Notes (Signed)
1hr Glucola test- done.

## 2013-08-02 LAB — OBSTETRIC PANEL
Antibody Screen: NEGATIVE
Basophils Absolute: 0 10*3/uL (ref 0.0–0.1)
Basophils Relative: 0 % (ref 0–1)
EOS PCT: 1 % (ref 0–5)
Eosinophils Absolute: 0.1 10*3/uL (ref 0.0–0.7)
HCT: 35 % — ABNORMAL LOW (ref 36.0–46.0)
HEMOGLOBIN: 11.9 g/dL — AB (ref 12.0–15.0)
Hepatitis B Surface Ag: NEGATIVE
LYMPHS ABS: 1.7 10*3/uL (ref 0.7–4.0)
Lymphocytes Relative: 23 % (ref 12–46)
MCH: 32.2 pg (ref 26.0–34.0)
MCHC: 34 g/dL (ref 30.0–36.0)
MCV: 94.6 fL (ref 78.0–100.0)
MONOS PCT: 10 % (ref 3–12)
Monocytes Absolute: 0.8 10*3/uL (ref 0.1–1.0)
Neutro Abs: 5 10*3/uL (ref 1.7–7.7)
Neutrophils Relative %: 66 % (ref 43–77)
PLATELETS: 331 10*3/uL (ref 150–400)
RBC: 3.7 MIL/uL — AB (ref 3.87–5.11)
RDW: 14.6 % (ref 11.5–15.5)
RUBELLA: 3.67 {index} — AB (ref <0.90)
Rh Type: POSITIVE
WBC: 7.6 10*3/uL (ref 4.0–10.5)

## 2013-08-02 LAB — GLUCOSE TOLERANCE, 1 HOUR (50G) W/O FASTING: GLUCOSE 1 HOUR GTT: 100 mg/dL (ref 70–140)

## 2013-08-03 LAB — HEMOGLOBINOPATHY EVALUATION
HEMOGLOBIN OTHER: 0 %
Hgb A2 Quant: 2.9 % (ref 2.2–3.2)
Hgb A: 97.1 % (ref 96.8–97.8)
Hgb F Quant: 0 % (ref 0.0–2.0)
Hgb S Quant: 0 %

## 2013-08-06 LAB — CYTOLOGY - PAP

## 2013-08-08 ENCOUNTER — Ambulatory Visit (HOSPITAL_COMMUNITY)
Admission: RE | Admit: 2013-08-08 | Discharge: 2013-08-08 | Disposition: A | Discharge status: Home / Self Care | Payer: Medicaid Other | Source: Ambulatory Visit | Attending: Obstetrics and Gynecology | Admitting: Obstetrics and Gynecology

## 2013-08-08 ENCOUNTER — Other Ambulatory Visit: Payer: Self-pay

## 2013-08-08 ENCOUNTER — Encounter (HOSPITAL_COMMUNITY): Payer: Self-pay

## 2013-08-08 DIAGNOSIS — O09529 Supervision of elderly multigravida, unspecified trimester: Secondary | ICD-10-CM | POA: Insufficient documentation

## 2013-08-08 DIAGNOSIS — O99330 Smoking (tobacco) complicating pregnancy, unspecified trimester: Secondary | ICD-10-CM | POA: Insufficient documentation

## 2013-08-08 DIAGNOSIS — O09512 Supervision of elderly primigravida, second trimester: Secondary | ICD-10-CM

## 2013-08-08 DIAGNOSIS — IMO0002 Reserved for concepts with insufficient information to code with codable children: Secondary | ICD-10-CM | POA: Insufficient documentation

## 2013-08-08 DIAGNOSIS — O09519 Supervision of elderly primigravida, unspecified trimester: Secondary | ICD-10-CM | POA: Insufficient documentation

## 2013-08-08 NOTE — Progress Notes (Signed)
Genetic Counseling  High-Risk Gestation Note  Appointment Date:  08/08/2013 Referred By: Lorene Dy, CNM Date of Birth:  28-Jun-1975 Partner:  Teresa Orn Sr.    Pregnancy History: G1P0000 Estimated Date of Delivery: 01/16/14 Estimated Gestational Age: [redacted]w[redacted]d Attending: [place], MD  [name] was seen for genetic counseling because of a maternal age of 38.   She was counseled regarding maternal age and the association with risk for chromosome conditions due to nondisjunction with aging of the ova.   We reviewed chromosomes, nondisjunction, and the associated 1 in 76 risk for fetal aneuploidy related to a maternal age of 38 y.o. at [redacted]w[redacted]d gestation.  She was counseled that the risk for aneuploidy decreases as gestational age increases, accounting for those pregnancies which spontaneously abort.  We specifically discussed Down syndrome (trisomy 53), trisomies 23 and 32, and sex chromosome aneuploidies (47,XXX and 47,XXY) including the common features and prognoses of each.   We reviewed available screening options including Quad screen, noninvasive prenatal screening (NIPS)/cell free DNA (cfDNA) testing, and detailed ultrasound.  She was counseled that screening tests are used to modify a patient's a priori risk for aneuploidy, typically based on age. This estimate provides a pregnancy specific risk assessment. We reviewed the benefits and limitations of each option. Specifically, we discussed the conditions for which each test screens, the detection rates, and false positive rates of each. She was also counseled regarding diagnostic testing via amniocentesis. We reviewed the approximate 1 in [phone] risk for complications for amniocentesis, including spontaneous pregnancy loss. After consideration of all the options, she elected to proceed with NIPS.  Those results will be available in 8-10 days.    She also expressed interest in having a detailed ultrasound.  She was scheduled  for a detailed anatomy ultrasound on July , 2015.  She understands that screening tests cannot rule out all birth defects or genetic syndromes. The patient was advised of this limitation and states she still does not want diagnostic testing at this time.   In addition, Teresa Cain reported that her partner is 28 years old.  She was counseled that advanced paternal age (APA) is defined as paternal age greater than or equal to age 55.  Recent large-scale sequencing studies have shown that approximately 80% of de novo point mutations are of paternal origin.  Many studies have demonstrated a strong correlation between increased paternal age and de novo point mutations.  Although no specific data is available regarding fetal risks for fathers 38+ years old at conception, it is apparent that the overall risk for single gene conditions is increased.  To estimate the relative increase in risk of a genetic disorder with APA, the heritability of the disease must be considered.  Assuming an approximate 2x increase in risk for conditions that are exclusively paternal in origin, the risk for each individual condition is still relatively low.  It is estimated that the overall chance for a de novo mutation is ~0.5%.  We also discussed the wide range of conditions which can be caused by new dominant gene mutations (achondroplasia, neurofibromatosis, Marfan syndrome etc.).  She was counseled that genetic testing for each individual single gene condition is not warranted or available unless ultrasound or family history concerns lend suspicion to a specific condition.  We discussed the recommendation for a detailed ultrasound at 18+ weeks gestation and a follow up ultrasound at ~28 weeks to monitor fetal growth.  We also discussed that newer literature suggests that the risk  for autism spectrum disorders (ASD) may be increased in children born to fathers of APA.  We discussed that ASDs are among the most common neurodevelopmental  disorders, with approximately 1 in 85 children meeting criteria for ASD.  Approximately 80% of individuals diagnosed are female.  There is strong evidence that genetic factors play a critical role in development of ASD.  While there have been recent advances in identifying specific genetic causes of ASD, there are still many individuals for whom the etiology of the ASD is not known.  She understands that at this time there is no reliable, comprehensive genetic testing available for ASD.     Teresa Cain was provided with written information regarding sickle cell anemia (SCA) including the carrier frequency and incidence in the African-American population, the availability of carrier testing and prenatal diagnosis if indicated.  In addition, we discussed that hemoglobinopathies are routinely screened for as part of the [place] newborn screening panel.  We reviewed the normal results of Teresa Cain hemoglobin electrophoresis.  Both family histories were reviewed and found to be noncontributory for birth defects, intellectual disability, and known genetic conditions. Without further information regarding the provided family history, an accurate genetic risk cannot be calculated. Further genetic counseling is warranted if more information is obtained.  Teresa Cain denied exposure to environmental toxins or chemical agents. She denied the use of alcohol and street drugs.  She reported that she smokes ~6 cigarettes/day.  We discussed the implications and counseled regarding cessation.  She denied significant viral illnesses during the course of her pregnancy. Her medical and surgical histories were noncontributory.   I counseled Teresa Cain regarding the above risks and available options.  The approximate face-to-face time with the genetic counselor was 35 minutes.  Filbert Schilder, MS Certified Genetic Counselor

## 2013-08-20 ENCOUNTER — Telehealth (HOSPITAL_COMMUNITY): Payer: Self-pay

## 2013-08-20 NOTE — Telephone Encounter (Signed)
Called Lorraine AFUA HOOTS to discuss her cell free fetal DNA test results.  Ms. CHELLIE VANLUE had Panorama testing through [facility].  Testing was offered because of a maternal age of 72.   The patient was identified by name and DOB.  We reviewed that these are within normal limits, showing a less than 1 in 10,000 risk for trisomies 21, 18 and 13, and monosomy X (Turner syndrome).  We reviewed that this testing identifies > 99% of pregnancies with trisomy 75, trisomy 56, sex chromosome trisomies (47,XXX and 47,XXY), and triploidy. The detection rate for trisomy 18 is 96%.  The detection rate for monosomy X is ~92%.  The false positive rate is <0.1% for all conditions. Testing was also consistent with female fetal sex.  The patient did wish to know fetal sex.  She understands that this testing does not identify all genetic conditions.  All questions were answered to her satisfaction, she was encouraged to call with additional questions or concerns.  Filbert Schilder, MS Certified Genetic Counselor

## 2013-08-22 ENCOUNTER — Ambulatory Visit (HOSPITAL_COMMUNITY): Admission: RE | Admit: 2013-08-22 | Payer: Medicaid Other | Source: Ambulatory Visit

## 2013-08-29 ENCOUNTER — Ambulatory Visit (INDEPENDENT_AMBULATORY_CARE_PROVIDER_SITE_OTHER): Payer: Medicaid Other | Admitting: Family

## 2013-08-29 VITALS — BP 109/72 | HR 82 | Temp 97.9°F | Wt 136.4 lb

## 2013-08-29 DIAGNOSIS — O09529 Supervision of elderly multigravida, unspecified trimester: Secondary | ICD-10-CM

## 2013-08-29 DIAGNOSIS — Z3492 Encounter for supervision of normal pregnancy, unspecified, second trimester: Secondary | ICD-10-CM

## 2013-08-29 DIAGNOSIS — Z3400 Encounter for supervision of normal first pregnancy, unspecified trimester: Secondary | ICD-10-CM

## 2013-08-29 DIAGNOSIS — O09522 Supervision of elderly multigravida, second trimester: Secondary | ICD-10-CM

## 2013-08-29 DIAGNOSIS — Z3402 Encounter for supervision of normal first pregnancy, second trimester: Secondary | ICD-10-CM

## 2013-08-29 DIAGNOSIS — Z3480 Encounter for supervision of other normal pregnancy, unspecified trimester: Secondary | ICD-10-CM

## 2013-08-29 LAB — POCT URINALYSIS DIP (DEVICE)
BILIRUBIN URINE: NEGATIVE
GLUCOSE, UA: NEGATIVE mg/dL
Hgb urine dipstick: NEGATIVE
Ketones, ur: NEGATIVE mg/dL
NITRITE: NEGATIVE
Protein, ur: NEGATIVE mg/dL
Specific Gravity, Urine: 1.015 (ref 1.005–1.030)
Urobilinogen, UA: 0.2 mg/dL (ref 0.0–1.0)
pH: 7.5 (ref 5.0–8.0)

## 2013-08-29 NOTE — Progress Notes (Signed)
Reports decreasing to 4 cigs/day from a pack.  Reviewed panorama (nml, female).  Schedule detailed anatomy.

## 2013-08-31 LAB — CULTURE, OB URINE: Colony Count: >=100000

## 2013-09-02 LAB — CANNABANOIDS (GC/LC/MS), URINE: THC-COOH (GC/LC/MS), ur confirm: 223 ng/mL — AB (ref <5)

## 2013-09-04 LAB — PRESCRIPTION MONITORING PROFILE (19 PANEL)
Amphetamine/Meth: NEGATIVE ng/mL
BENZODIAZEPINE SCREEN, URINE: NEGATIVE ng/mL
BUPRENORPHINE, URINE: NEGATIVE ng/mL
Barbiturate Screen, Urine: NEGATIVE ng/mL
COCAINE METABOLITES: NEGATIVE ng/mL
Carisoprodol, Urine: NEGATIVE ng/mL
Creatinine, Urine: 107.01 mg/dL (ref >20.0)
ECSTASY: NEGATIVE ng/mL
FENTANYL URINE: NEGATIVE ng/mL
MEPERIDINE UR: NEGATIVE ng/mL
Methadone Screen, Urine: NEGATIVE ng/mL
Methaqualone: NEGATIVE ng/mL
Nitrites, Initial: NEGATIVE ug/mL
Opiate Screen, Urine: NEGATIVE ng/mL
Oxycodone Screen, Ur: NEGATIVE ng/mL
Phencyclidine, Ur: NEGATIVE ng/mL
Propoxyphene: NEGATIVE ng/mL
Tapentadol, urine: NEGATIVE ng/mL
Tramadol Scrn, Ur: NEGATIVE ng/mL
ZOLPIDEM, URINE: NEGATIVE ng/mL
pH, Initial: 7.9 pH (ref 4.5–8.9)

## 2013-09-05 ENCOUNTER — Ambulatory Visit (HOSPITAL_COMMUNITY): Payer: Medicaid Other

## 2013-09-07 ENCOUNTER — Encounter (HOSPITAL_COMMUNITY): Payer: Self-pay

## 2013-09-07 ENCOUNTER — Other Ambulatory Visit (HOSPITAL_COMMUNITY): Payer: Self-pay | Admitting: Maternal and Fetal Medicine

## 2013-09-07 ENCOUNTER — Ambulatory Visit (HOSPITAL_COMMUNITY)
Admission: RE | Admit: 2013-09-07 | Discharge: 2013-09-07 | Disposition: A | Discharge status: Home / Self Care | Payer: Medicaid Other | Source: Ambulatory Visit | Attending: Family | Admitting: Family

## 2013-09-07 DIAGNOSIS — O09529 Supervision of elderly multigravida, unspecified trimester: Secondary | ICD-10-CM | POA: Diagnosis not present

## 2013-09-07 DIAGNOSIS — Z3689 Encounter for other specified antenatal screening: Secondary | ICD-10-CM | POA: Diagnosis not present

## 2013-09-07 DIAGNOSIS — O09522 Supervision of elderly multigravida, second trimester: Secondary | ICD-10-CM

## 2013-09-11 ENCOUNTER — Encounter: Payer: Self-pay | Admitting: Family

## 2013-09-26 ENCOUNTER — Ambulatory Visit (INDEPENDENT_AMBULATORY_CARE_PROVIDER_SITE_OTHER): Payer: Medicaid Other | Admitting: Advanced Practice Midwife

## 2013-09-26 VITALS — BP 118/71 | HR 102 | Temp 98.1°F | Wt 142.0 lb

## 2013-09-26 DIAGNOSIS — O09522 Supervision of elderly multigravida, second trimester: Secondary | ICD-10-CM

## 2013-09-26 DIAGNOSIS — O09529 Supervision of elderly multigravida, unspecified trimester: Secondary | ICD-10-CM

## 2013-09-26 DIAGNOSIS — Z3402 Encounter for supervision of normal first pregnancy, second trimester: Secondary | ICD-10-CM

## 2013-09-26 DIAGNOSIS — Z3400 Encounter for supervision of normal first pregnancy, unspecified trimester: Secondary | ICD-10-CM

## 2013-09-26 LAB — POCT URINALYSIS DIP (DEVICE)
Bilirubin Urine: NEGATIVE
GLUCOSE, UA: 500 mg/dL — AB
Hgb urine dipstick: NEGATIVE
Ketones, ur: NEGATIVE mg/dL
Leukocytes, UA: NEGATIVE
Nitrite: NEGATIVE
PROTEIN: NEGATIVE mg/dL
SPECIFIC GRAVITY, URINE: 1.01 (ref 1.005–1.030)
UROBILINOGEN UA: 0.2 mg/dL (ref 0.0–1.0)
pH: 6 (ref 5.0–8.0)

## 2013-09-26 NOTE — Progress Notes (Signed)
Patient reports pelvic pressure and pain

## 2013-09-26 NOTE — Progress Notes (Signed)
24 weeks, glucose in urine - drank apple juice and ate Captain Crunch cereal just prior to appointment. Smoking 4 cig/day and encouraged to cut back further Has follow up U/S scheduled for 9/9 due to AMA/APA/tobacco use Decrease sugar intake.  Increase protein intake.  Extensive discussion regarding this. Recheck GTT next appt.  RTC 4 weeks

## 2013-10-19 ENCOUNTER — Ambulatory Visit (HOSPITAL_COMMUNITY): Payer: Medicaid Other | Attending: Maternal and Fetal Medicine

## 2013-10-24 ENCOUNTER — Encounter: Payer: Self-pay | Admitting: Obstetrics and Gynecology

## 2013-10-24 ENCOUNTER — Ambulatory Visit (INDEPENDENT_AMBULATORY_CARE_PROVIDER_SITE_OTHER): Payer: Medicaid Other | Admitting: Obstetrics and Gynecology

## 2013-10-24 VITALS — BP 130/62 | HR 108 | Temp 98.5°F | Wt 142.0 lb

## 2013-10-24 DIAGNOSIS — O3410 Maternal care for benign tumor of corpus uteri, unspecified trimester: Secondary | ICD-10-CM

## 2013-10-24 DIAGNOSIS — O09529 Supervision of elderly multigravida, unspecified trimester: Secondary | ICD-10-CM

## 2013-10-24 DIAGNOSIS — O3413 Maternal care for benign tumor of corpus uteri, third trimester: Secondary | ICD-10-CM

## 2013-10-24 DIAGNOSIS — Z3400 Encounter for supervision of normal first pregnancy, unspecified trimester: Secondary | ICD-10-CM

## 2013-10-24 DIAGNOSIS — Z23 Encounter for immunization: Secondary | ICD-10-CM

## 2013-10-24 DIAGNOSIS — O09522 Supervision of elderly multigravida, second trimester: Secondary | ICD-10-CM

## 2013-10-24 DIAGNOSIS — Z3402 Encounter for supervision of normal first pregnancy, second trimester: Secondary | ICD-10-CM

## 2013-10-24 DIAGNOSIS — D259 Leiomyoma of uterus, unspecified: Secondary | ICD-10-CM

## 2013-10-24 LAB — POCT URINALYSIS DIP (DEVICE)
Bilirubin Urine: NEGATIVE
GLUCOSE, UA: NEGATIVE mg/dL
HGB URINE DIPSTICK: NEGATIVE
KETONES UR: NEGATIVE mg/dL
Leukocytes, UA: NEGATIVE
Nitrite: NEGATIVE
Protein, ur: NEGATIVE mg/dL
SPECIFIC GRAVITY, URINE: 1.01 (ref 1.005–1.030)
UROBILINOGEN UA: 0.2 mg/dL (ref 0.0–1.0)
pH: 6 (ref 5.0–8.0)

## 2013-10-24 LAB — CBC
HEMATOCRIT: 34.3 % — AB (ref 36.0–46.0)
HEMOGLOBIN: 11.9 g/dL — AB (ref 12.0–15.0)
MCH: 33 pg (ref 26.0–34.0)
MCHC: 34.7 g/dL (ref 30.0–36.0)
MCV: 95 fL (ref 78.0–100.0)
Platelets: 333 10*3/uL (ref 150–400)
RBC: 3.61 MIL/uL — ABNORMAL LOW (ref 3.87–5.11)
RDW: 13.6 % (ref 11.5–15.5)
WBC: 9.5 10*3/uL (ref 4.0–10.5)

## 2013-10-24 LAB — GLUCOSE TOLERANCE, 1 HOUR (50G) W/O FASTING: Glucose, 1 Hour GTT: 165 mg/dL — ABNORMAL HIGH (ref 70–140)

## 2013-10-24 LAB — HIV ANTIBODY (ROUTINE TESTING W REFLEX): HIV 1&2 Ab, 4th Generation: NONREACTIVE

## 2013-10-24 LAB — RPR

## 2013-10-24 MED ORDER — TETANUS-DIPHTH-ACELL PERTUSSIS 5-2.5-18.5 LF-MCG/0.5 IM SUSP: 0.5000 mL | Freq: Once | INTRAMUSCULAR | Status: DC

## 2013-10-24 NOTE — Patient Instructions (Signed)

## 2013-10-24 NOTE — Progress Notes (Signed)
Reports occasional pelvic pressure/pain.  1hr gtt and labs today. Tdap today-- information sheet given. Discussed flu vaccine-- patient declines.

## 2013-10-24 NOTE — Progress Notes (Signed)
Stopped marijuana and down to 3 cig/d> will quit. No dysuria or UCs. Explained small fibroids. Rescheduled [place]. 28 labs today.

## 2013-10-25 ENCOUNTER — Ambulatory Visit (HOSPITAL_COMMUNITY)
Admission: RE | Admit: 2013-10-25 | Discharge: 2013-10-25 | Disposition: A | Discharge status: Home / Self Care | Payer: Medicaid Other | Source: Ambulatory Visit | Attending: Advanced Practice Midwife | Admitting: Advanced Practice Midwife

## 2013-10-25 ENCOUNTER — Encounter (HOSPITAL_COMMUNITY): Payer: Self-pay

## 2013-10-25 VITALS — BP 121/61 | HR 109 | Wt 144.8 lb

## 2013-10-25 DIAGNOSIS — O99330 Smoking (tobacco) complicating pregnancy, unspecified trimester: Secondary | ICD-10-CM | POA: Insufficient documentation

## 2013-10-25 DIAGNOSIS — O09529 Supervision of elderly multigravida, unspecified trimester: Secondary | ICD-10-CM | POA: Diagnosis present

## 2013-10-25 DIAGNOSIS — O3410 Maternal care for benign tumor of corpus uteri, unspecified trimester: Secondary | ICD-10-CM | POA: Diagnosis not present

## 2013-10-25 DIAGNOSIS — F17200 Nicotine dependence, unspecified, uncomplicated: Secondary | ICD-10-CM

## 2013-10-25 DIAGNOSIS — D259 Leiomyoma of uterus, unspecified: Secondary | ICD-10-CM | POA: Diagnosis not present

## 2013-10-25 DIAGNOSIS — O3413 Maternal care for benign tumor of corpus uteri, third trimester: Secondary | ICD-10-CM

## 2013-10-31 ENCOUNTER — Telehealth: Payer: Self-pay | Admitting: General Practice

## 2013-10-31 NOTE — Telephone Encounter (Signed)
Message copied by Shelly Coss on Wed Oct 31, 2013 10:42 AM ------      Message from: POE, DEIRDRE C      Created: Wed Oct 31, 2013  9:27 AM      Regarding: 3 hr       Needs 3 hr OGTT ------

## 2013-10-31 NOTE — Telephone Encounter (Signed)
Called patient and informed her of results and need for 3 hr gtt. Patient verbalized understanding and states she can come tomorrow morning at 8. Patient is aware to come in fasting. Patient had no questions

## 2013-11-01 ENCOUNTER — Other Ambulatory Visit: Payer: Medicaid Other

## 2013-11-01 DIAGNOSIS — O99814 Abnormal glucose complicating childbirth: Secondary | ICD-10-CM

## 2013-11-02 ENCOUNTER — Encounter: Payer: Self-pay | Admitting: Obstetrics and Gynecology

## 2013-11-02 DIAGNOSIS — O24419 Gestational diabetes mellitus in pregnancy, unspecified control: Secondary | ICD-10-CM | POA: Insufficient documentation

## 2013-11-02 LAB — GLUCOSE TOLERANCE, 3 HOURS
Glucose Tolerance, 1 hour: 201 mg/dL — ABNORMAL HIGH (ref 70–189)
Glucose Tolerance, 2 hour: 152 mg/dL (ref 70–164)
Glucose Tolerance, Fasting: 100 mg/dL (ref 70–104)
Glucose, GTT - 3 Hour: 95 mg/dL (ref 70–144)

## 2013-11-05 ENCOUNTER — Telehealth: Payer: Self-pay | Admitting: General Practice

## 2013-11-05 NOTE — Telephone Encounter (Signed)
Called patient and informed her of results. Patient verbalized understanding. Patient has appt this Thursday 9/24 @ 2, called [facility]FM and received appt for 230 that day. Informed patient of appt and that she will go upstairs right after we see her. Patient verbalized understanding and had no questions

## 2013-11-05 NOTE — Telephone Encounter (Signed)
Message copied by Shelly Coss on Mon Nov 05, 2013 11:08 AM ------      Message from: Teresa Cain      Created: Fri Nov 02, 2013  6:09 PM       Please inform patient of abnormal 3 hour test and diagnosis of diabetes in pregnancy. If possible have patient meet with diabetic educator prior to next appointment ------

## 2013-11-08 ENCOUNTER — Ambulatory Visit (HOSPITAL_COMMUNITY)
Admission: RE | Admit: 2013-11-08 | Discharge: 2013-11-08 | Disposition: A | Discharge status: Home / Self Care | Payer: Medicaid Other | Source: Ambulatory Visit | Attending: Advanced Practice Midwife | Admitting: Advanced Practice Midwife

## 2013-11-08 ENCOUNTER — Ambulatory Visit (INDEPENDENT_AMBULATORY_CARE_PROVIDER_SITE_OTHER): Payer: Medicaid Other | Admitting: Advanced Practice Midwife

## 2013-11-08 VITALS — BP 131/74 | HR 118 | Wt 146.5 lb

## 2013-11-08 DIAGNOSIS — F17200 Nicotine dependence, unspecified, uncomplicated: Secondary | ICD-10-CM

## 2013-11-08 DIAGNOSIS — O24919 Unspecified diabetes mellitus in pregnancy, unspecified trimester: Secondary | ICD-10-CM

## 2013-11-08 DIAGNOSIS — O24410 Gestational diabetes mellitus in pregnancy, diet controlled: Secondary | ICD-10-CM

## 2013-11-08 DIAGNOSIS — O99810 Abnormal glucose complicating pregnancy: Secondary | ICD-10-CM

## 2013-11-08 LAB — POCT URINALYSIS DIP (DEVICE)
BILIRUBIN URINE: NEGATIVE
GLUCOSE, UA: 250 mg/dL — AB
HGB URINE DIPSTICK: NEGATIVE
Leukocytes, UA: NEGATIVE
Nitrite: NEGATIVE
Protein, ur: 30 mg/dL — AB
SPECIFIC GRAVITY, URINE: 1.02 (ref 1.005–1.030)
Urobilinogen, UA: 0.2 mg/dL (ref 0.0–1.0)
pH: 6 (ref 5.0–8.0)

## 2013-11-08 NOTE — Patient Instructions (Signed)
Gestational Diabetes Mellitus Gestational diabetes mellitus, often simply referred to as gestational diabetes, is a type of diabetes that some women develop during pregnancy. In gestational diabetes, the pancreas does not make enough insulin (a hormone), the cells are less responsive to the insulin that is made (insulin resistance), or both.Normally, insulin moves sugars from food into the tissue cells. The tissue cells use the sugars for energy. The lack of insulin or the lack of normal response to insulin causes excess sugars to build up in the blood instead of going into the tissue cells. As a result, high blood sugar (hyperglycemia) develops. The effect of high sugar (glucose) levels can cause many problems.  RISK FACTORS You have an increased chance of developing gestational diabetes if you have a family history of diabetes and also have one or more of the following risk factors:  A body mass index over 30 (obesity).  A previous pregnancy with gestational diabetes.  An older age at the time of pregnancy. If blood glucose levels are kept in the normal range during pregnancy, women can have a healthy pregnancy. If your blood glucose levels are not well controlled, there may be risks to you, your unborn baby (fetus), your labor and delivery, or your newborn baby.  SYMPTOMS  If symptoms are experienced, they are much like symptoms you would normally expect during pregnancy. The symptoms of gestational diabetes include:   Increased thirst (polydipsia).  Increased urination (polyuria).  Increased urination during the night (nocturia).  Weight loss. This weight loss may be rapid.  Frequent, recurring infections.  Tiredness (fatigue).  Weakness.  Vision changes, such as blurred vision.  Fruity smell to your breath.  Abdominal pain. DIAGNOSIS Diabetes is diagnosed when blood glucose levels are increased. Your blood glucose level may be checked by one or more of the following blood  tests:  A fasting blood glucose test. You will not be allowed to eat for at least 8 hours before a blood sample is taken.  A random blood glucose test. Your blood glucose is checked at any time of the day regardless of when you ate.  A hemoglobin A1c blood glucose test. A hemoglobin A1c test provides information about blood glucose control over the previous 3 months.  An oral glucose tolerance test (OGTT). Your blood glucose is measured after you have not eaten (fasted) for 1-3 hours and then after you drink a glucose-containing beverage. Since the hormones that cause insulin resistance are highest at about 24-28 weeks of a pregnancy, an OGTT is usually performed during that time. If you have risk factors for gestational diabetes, your health care provider may test you for gestational diabetes earlier than 24 weeks of pregnancy. TREATMENT   You will need to take diabetes medicine or insulin daily to keep blood glucose levels in the desired range.  You will need to match insulin dosing with exercise and healthy food choices. The treatment goal is to maintain the before-meal (preprandial), bedtime, and overnight blood glucose level at 60-99 mg/dL during pregnancy. The treatment goal is to further maintain peak after-meal blood sugar (postprandial glucose) level at 100-140 mg/dL. HOME CARE INSTRUCTIONS   Have your hemoglobin A1c level checked twice a year.  Perform daily blood glucose monitoring as directed by your health care provider. It is common to perform frequent blood glucose monitoring.  Monitor urine ketones when you are ill and as directed by your health care provider.  Take your diabetes medicine and insulin as directed by your health care provider  to maintain your blood glucose level in the desired range.  Never run out of diabetes medicine or insulin. It is needed every day.  Adjust insulin based on your intake of carbohydrates. Carbohydrates can raise blood glucose levels but  need to be included in your diet. Carbohydrates provide vitamins, minerals, and fiber which are an essential part of a healthy diet. Carbohydrates are found in fruits, vegetables, whole grains, dairy products, legumes, and foods containing added sugars.  Eat healthy foods. Alternate 3 meals with 3 snacks.  Maintain a healthy weight gain. The usual total expected weight gain varies according to your prepregnancy body mass index (BMI).  Carry a medical alert card or wear your medical alert jewelry.  Carry a 15-gram carbohydrate snack with you at all times to treat low blood glucose (hypoglycemia). Some examples of 15-gram carbohydrate snacks include:  Glucose tablets, 3 or 4.  Glucose gel, 15-gram tube.  Raisins, 2 tablespoons (24 g).  Jelly beans, 6.  Animal crackers, 8.  Fruit juice, regular soda, or low-fat milk, 4 ounces (120 mL).  Gummy treats, 9.  Recognize hypoglycemia. Hypoglycemia during pregnancy occurs with blood glucose levels of 60 mg/dL and below. The risk for hypoglycemia increases when fasting or skipping meals, during or after intense exercise, and during sleep. Hypoglycemia symptoms can include:  Tremors or shakes.  Decreased ability to concentrate.  Sweating.  Increased heart rate.  Headache.  Dry mouth.  Hunger.  Irritability.  Anxiety.  Restless sleep.  Altered speech or coordination.  Confusion.  Treat hypoglycemia promptly. If you are alert and able to safely swallow, follow the 15:15 rule:  Take 15-20 grams of rapid-acting glucose or carbohydrate. Rapid-acting options include glucose gel, glucose tablets, or 4 ounces (120 mL) of fruit juice, regular soda, or low-fat milk.  Check your blood glucose level 15 minutes after taking the glucose.  Take 15-20 grams more of glucose if the repeat blood glucose level is still 70 mg/dL or below.  Eat a meal or snack within 1 hour once blood glucose levels return to normal.  Be alert to polyuria  (excess urination) and polydipsia (excess thirst) which are early signs of hyperglycemia. An early awareness of hyperglycemia allows for prompt treatment. Treat hyperglycemia as directed by your health care provider.  Engage in at least 30 minutes of physical activity a day or as directed by your health care provider. Ten minutes of physical activity timed 30 minutes after each meal is encouraged to control postprandial blood glucose levels.  Adjust your insulin dosing and food intake as needed if you start a new exercise or sport.  Follow your sick-day plan at any time you are unable to eat or drink as usual.  Avoid tobacco and alcohol use.  Keep all follow-up visits as directed by your health care provider.  Follow the advice of your health care provider regarding your prenatal and post-delivery (postpartum) appointments, meal planning, exercise, medicines, vitamins, blood tests, other medical tests, and physical activities.  Perform daily skin and foot care. Examine your skin and feet daily for cuts, bruises, redness, nail problems, bleeding, blisters, or sores.  Brush your teeth and gums at least twice a day and floss at least once a day. Follow up with your dentist regularly.  Schedule an eye exam during the first trimester of your pregnancy or as directed by your health care provider.  Share your diabetes management plan with your workplace or school.  Stay up-to-date with immunizations.  Learn to manage stress.    Obtain ongoing diabetes education and support as needed.  Learn about and consider breastfeeding your baby.  You should have your blood sugar level checked 6-12 weeks after delivery. This is done with an oral glucose tolerance test (OGTT). SEEK MEDICAL CARE IF:   You are unable to eat food or drink fluids for more than 6 hours.  You have nausea and vomiting for more than 6 hours.  You have a blood glucose level of 200 mg/dL and you have ketones in your  urine.  There is a change in mental status.  You develop vision problems.  You have a persistent headache.  You have upper abdominal pain or discomfort.  You develop an additional serious illness.  You have diarrhea for more than 6 hours.  You have been sick or have had a fever for a couple of days and are not getting better. SEEK IMMEDIATE MEDICAL CARE IF:   You have difficulty breathing.  You no longer feel the baby moving.  You are bleeding or have discharge from your vagina.  You start having premature contractions or labor. MAKE SURE YOU:  Understand these instructions.  Will watch your condition.  Will get help right away if you are not doing well or get worse. Document Released: 05/10/2000 Document Revised: 06/18/2013 Document Reviewed: 08/31/2011 [facility] Patient Information 2015 [place], [place]. This information is not intended to replace advice given to you by your health care provider. Make sure you discuss any questions you have with your health care provider.

## 2013-11-08 NOTE — Progress Notes (Signed)
  Patient was seen on 11/08/13 for Gestational Diabetes self-management . The following learning objectives were met by the patient :   States the definition of Gestational Diabetes  States why dietary management is important in controlling blood glucose  Describes the effects of carbohydrates on blood glucose levels  Demonstrates ability to create a balanced meal plan  Demonstrates carbohydrate counting   States when to check blood glucose levels  Demonstrates proper blood glucose monitoring techniques  States the effect of stress and exercise on blood glucose levels  States the importance of limiting caffeine and abstaining from alcohol and smoking  Plan:  Aim for 2 Carb Choices per meal (30 grams) +/- 1 either way for breakfast Aim for 3 Carb Choices per meal (45 grams) +/- 1 either way from lunch and dinner Aim for 1-2 Carbs per snack Begin reading food labels for Total Carbohydrate and sugar grams of foods Consider  increasing your activity level by walking daily as tolerated Begin checking BG before breakfast and 2 hours after first bit of breakfast, lunch and dinner after  as directed by MD  Take medication  as directed by MD  Blood glucose monitor given: Accu Chek Nano BG Monitoring Kit Lot # [id number] Exp: 10/16/14 Blood glucose reading: 198m/dl  Patient instructed to monitor glucose levels: FBS: 60 - <90 2 hour: <120  Patient received the following handouts:  Nutrition Diabetes and Pregnancy  Carbohydrate Counting List  Meal Planning worksheet  [facility]

## 2013-11-08 NOTE — Progress Notes (Signed)
Doing well. Teresa Cain is moving a lot. Discussed gestational diabetes. Has appt with MFM to do counseling for diet/testing.  States stopped smoking cigarettes and MJ.

## 2013-11-12 ENCOUNTER — Ambulatory Visit: Payer: Medicaid Other | Admitting: *Deleted

## 2013-11-12 DIAGNOSIS — O99810 Abnormal glucose complicating pregnancy: Secondary | ICD-10-CM

## 2013-11-12 MED ORDER — GLUCOSE BLOOD VI STRP: ORAL_STRIP | Status: DC

## 2013-11-12 NOTE — Progress Notes (Signed)
Patient presents for review of glucose readings. Patient was unable to obtain test strips at pharmacy. She has only 4 readings which are WNL. I have communicated with new pharmacy and patient will pick up new meter and test strips today. I will see her again next week for review of current glucose readings. I saw patient last week at [facility] for Diabetes Education.

## 2013-11-19 ENCOUNTER — Ambulatory Visit (INDEPENDENT_AMBULATORY_CARE_PROVIDER_SITE_OTHER): Payer: Medicaid Other | Admitting: Family Medicine

## 2013-11-19 VITALS — BP 125/64 | HR 103 | Temp 98.2°F | Wt 149.4 lb

## 2013-11-19 DIAGNOSIS — O24419 Gestational diabetes mellitus in pregnancy, unspecified control: Secondary | ICD-10-CM

## 2013-11-19 DIAGNOSIS — Z23 Encounter for immunization: Secondary | ICD-10-CM

## 2013-11-19 DIAGNOSIS — Z3402 Encounter for supervision of normal first pregnancy, second trimester: Secondary | ICD-10-CM

## 2013-11-19 LAB — POCT URINALYSIS DIP (DEVICE)
BILIRUBIN URINE: NEGATIVE
Glucose, UA: NEGATIVE mg/dL
HGB URINE DIPSTICK: NEGATIVE
KETONES UR: NEGATIVE mg/dL
Leukocytes, UA: NEGATIVE
Nitrite: NEGATIVE
PH: 7 (ref 5.0–8.0)
Protein, ur: NEGATIVE mg/dL
Specific Gravity, Urine: 1.015 (ref 1.005–1.030)
Urobilinogen, UA: 0.2 mg/dL (ref 0.0–1.0)

## 2013-11-19 MED ORDER — GLYBURIDE 2.5 MG PO TABS: 2.5000 mg | ORAL_TABLET | Freq: Every evening | ORAL | Status: DC

## 2013-11-19 NOTE — Progress Notes (Signed)
Fasting blood sugars: 6/7 elevated.  All within 90s 2hr PP 2/21 elevated. Start glyburide 2.5mg  at bedtime.  Start twice weekly NST Thursday. Flu vaccination today.  Diabetes education today.

## 2013-11-19 NOTE — Progress Notes (Signed)
Pt reports having ear ache for the last 4 days Given info on what medications to take in pregnancy Flu vaccine consented and info given

## 2013-11-19 NOTE — Patient Instructions (Signed)
Gestational Diabetes Mellitus Gestational diabetes mellitus, often simply referred to as gestational diabetes, is a type of diabetes that some women develop during pregnancy. In gestational diabetes, the pancreas does not make enough insulin (a hormone), the cells are less responsive to the insulin that is made (insulin resistance), or both.Normally, insulin moves sugars from food into the tissue cells. The tissue cells use the sugars for energy. The lack of insulin or the lack of normal response to insulin causes excess sugars to build up in the blood instead of going into the tissue cells. As a result, high blood sugar (hyperglycemia) develops. The effect of high sugar (glucose) levels can cause many problems.  RISK FACTORS You have an increased chance of developing gestational diabetes if you have a family history of diabetes and also have one or more of the following risk factors:  A body mass index over 30 (obesity).  A previous pregnancy with gestational diabetes.  An older age at the time of pregnancy. If blood glucose levels are kept in the normal range during pregnancy, women can have a healthy pregnancy. If your blood glucose levels are not well controlled, there may be risks to you, your unborn baby (fetus), your labor and delivery, or your newborn baby.  SYMPTOMS  If symptoms are experienced, they are much like symptoms you would normally expect during pregnancy. The symptoms of gestational diabetes include:   Increased thirst (polydipsia).  Increased urination (polyuria).  Increased urination during the night (nocturia).  Weight loss. This weight loss may be rapid.  Frequent, recurring infections.  Tiredness (fatigue).  Weakness.  Vision changes, such as blurred vision.  Fruity smell to your breath.  Abdominal pain. DIAGNOSIS Diabetes is diagnosed when blood glucose levels are increased. Your blood glucose level may be checked by one or more of the following blood  tests:  A fasting blood glucose test. You will not be allowed to eat for at least 8 hours before a blood sample is taken.  A random blood glucose test. Your blood glucose is checked at any time of the day regardless of when you ate.  A hemoglobin A1c blood glucose test. A hemoglobin A1c test provides information about blood glucose control over the previous 3 months.  An oral glucose tolerance test (OGTT). Your blood glucose is measured after you have not eaten (fasted) for 1-3 hours and then after you drink a glucose-containing beverage. Since the hormones that cause insulin resistance are highest at about 24-28 weeks of a pregnancy, an OGTT is usually performed during that time. If you have risk factors for gestational diabetes, your health care provider may test you for gestational diabetes earlier than 24 weeks of pregnancy. TREATMENT   You will need to take diabetes medicine or insulin daily to keep blood glucose levels in the desired range.  You will need to match insulin dosing with exercise and healthy food choices. The treatment goal is to maintain the before-meal (preprandial), bedtime, and overnight blood glucose level at 60-99 mg/dL during pregnancy. The treatment goal is to further maintain peak after-meal blood sugar (postprandial glucose) level at 100-140 mg/dL. HOME CARE INSTRUCTIONS   Have your hemoglobin A1c level checked twice a year.  Perform daily blood glucose monitoring as directed by your health care provider. It is common to perform frequent blood glucose monitoring.  Monitor urine ketones when you are ill and as directed by your health care provider.  Take your diabetes medicine and insulin as directed by your health care provider  to maintain your blood glucose level in the desired range.  Never run out of diabetes medicine or insulin. It is needed every day.  Adjust insulin based on your intake of carbohydrates. Carbohydrates can raise blood glucose levels but  need to be included in your diet. Carbohydrates provide vitamins, minerals, and fiber which are an essential part of a healthy diet. Carbohydrates are found in fruits, vegetables, whole grains, dairy products, legumes, and foods containing added sugars.  Eat healthy foods. Alternate 3 meals with 3 snacks.  Maintain a healthy weight gain. The usual total expected weight gain varies according to your prepregnancy body mass index (BMI).  Carry a medical alert card or wear your medical alert jewelry.  Carry a 15-gram carbohydrate snack with you at all times to treat low blood glucose (hypoglycemia). Some examples of 15-gram carbohydrate snacks include:  Glucose tablets, 3 or 4.  Glucose gel, 15-gram tube.  Raisins, 2 tablespoons (24 g).  Jelly beans, 6.  Animal crackers, 8.  Fruit juice, regular soda, or low-fat milk, 4 ounces (120 mL).  Gummy treats, 9.  Recognize hypoglycemia. Hypoglycemia during pregnancy occurs with blood glucose levels of 60 mg/dL and below. The risk for hypoglycemia increases when fasting or skipping meals, during or after intense exercise, and during sleep. Hypoglycemia symptoms can include:  Tremors or shakes.  Decreased ability to concentrate.  Sweating.  Increased heart rate.  Headache.  Dry mouth.  Hunger.  Irritability.  Anxiety.  Restless sleep.  Altered speech or coordination.  Confusion.  Treat hypoglycemia promptly. If you are alert and able to safely swallow, follow the 15:15 rule:  Take 15-20 grams of rapid-acting glucose or carbohydrate. Rapid-acting options include glucose gel, glucose tablets, or 4 ounces (120 mL) of fruit juice, regular soda, or low-fat milk.  Check your blood glucose level 15 minutes after taking the glucose.  Take 15-20 grams more of glucose if the repeat blood glucose level is still 70 mg/dL or below.  Eat a meal or snack within 1 hour once blood glucose levels return to normal.  Be alert to polyuria  (excess urination) and polydipsia (excess thirst) which are early signs of hyperglycemia. An early awareness of hyperglycemia allows for prompt treatment. Treat hyperglycemia as directed by your health care provider.  Engage in at least 30 minutes of physical activity a day or as directed by your health care provider. Ten minutes of physical activity timed 30 minutes after each meal is encouraged to control postprandial blood glucose levels.  Adjust your insulin dosing and food intake as needed if you start a new exercise or sport.  Follow your sick-day plan at any time you are unable to eat or drink as usual.  Avoid tobacco and alcohol use.  Keep all follow-up visits as directed by your health care provider.  Follow the advice of your health care provider regarding your prenatal and post-delivery (postpartum) appointments, meal planning, exercise, medicines, vitamins, blood tests, other medical tests, and physical activities.  Perform daily skin and foot care. Examine your skin and feet daily for cuts, bruises, redness, nail problems, bleeding, blisters, or sores.  Brush your teeth and gums at least twice a day and floss at least once a day. Follow up with your dentist regularly.  Schedule an eye exam during the first trimester of your pregnancy or as directed by your health care provider.  Share your diabetes management plan with your workplace or school.  Stay up-to-date with immunizations.  Learn to manage stress.    Obtain ongoing diabetes education and support as needed.  Learn about and consider breastfeeding your baby.  You should have your blood sugar level checked 6-12 weeks after delivery. This is done with an oral glucose tolerance test (OGTT). SEEK MEDICAL CARE IF:   You are unable to eat food or drink fluids for more than 6 hours.  You have nausea and vomiting for more than 6 hours.  You have a blood glucose level of 200 mg/dL and you have ketones in your  urine.  There is a change in mental status.  You develop vision problems.  You have a persistent headache.  You have upper abdominal pain or discomfort.  You develop an additional serious illness.  You have diarrhea for more than 6 hours.  You have been sick or have had a fever for a couple of days and are not getting better. SEEK IMMEDIATE MEDICAL CARE IF:   You have difficulty breathing.  You no longer feel the baby moving.  You are bleeding or have discharge from your vagina.  You start having premature contractions or labor. MAKE SURE YOU:  Understand these instructions.  Will watch your condition.  Will get help right away if you are not doing well or get worse. Document Released: 05/10/2000 Document Revised: 06/18/2013 Document Reviewed: 08/31/2011 [facility] Patient Information 2015 [place], [place]. This information is not intended to replace advice given to you by your health care provider. Make sure you discuss any questions you have with your health care provider.

## 2013-11-20 ENCOUNTER — Telehealth: Payer: Self-pay | Admitting: *Deleted

## 2013-11-20 DIAGNOSIS — O24419 Gestational diabetes mellitus in pregnancy, unspecified control: Secondary | ICD-10-CM

## 2013-11-20 MED ORDER — GLYBURIDE 2.5 MG PO TABS: 2.5000 mg | ORAL_TABLET | Freq: Every evening | ORAL | Status: DC

## 2013-11-20 NOTE — Telephone Encounter (Signed)
Patient states that she was supposed to get rx for glyburide sent to her pharmacy but when she went to get it it was not there.  Med reordered and sent to pharmacy.

## 2013-11-22 ENCOUNTER — Ambulatory Visit (INDEPENDENT_AMBULATORY_CARE_PROVIDER_SITE_OTHER): Payer: Medicaid Other | Admitting: *Deleted

## 2013-11-22 VITALS — BP 126/62 | HR 102

## 2013-11-22 DIAGNOSIS — O24419 Gestational diabetes mellitus in pregnancy, unspecified control: Secondary | ICD-10-CM

## 2013-11-26 ENCOUNTER — Ambulatory Visit (INDEPENDENT_AMBULATORY_CARE_PROVIDER_SITE_OTHER): Payer: Medicaid Other | Admitting: Obstetrics & Gynecology

## 2013-11-26 VITALS — BP 122/64 | HR 105 | Temp 97.9°F | Wt 148.7 lb

## 2013-11-26 DIAGNOSIS — O24419 Gestational diabetes mellitus in pregnancy, unspecified control: Secondary | ICD-10-CM

## 2013-11-26 DIAGNOSIS — O09523 Supervision of elderly multigravida, third trimester: Secondary | ICD-10-CM

## 2013-11-26 DIAGNOSIS — Z3402 Encounter for supervision of normal first pregnancy, second trimester: Secondary | ICD-10-CM

## 2013-11-26 LAB — US OB FOLLOW UP

## 2013-11-26 LAB — POCT URINALYSIS DIP (DEVICE)
Bilirubin Urine: NEGATIVE
Glucose, UA: NEGATIVE mg/dL
KETONES UR: NEGATIVE mg/dL
Leukocytes, UA: NEGATIVE
Nitrite: NEGATIVE
PH: 6 (ref 5.0–8.0)
PROTEIN: NEGATIVE mg/dL
Specific Gravity, Urine: 1.015 (ref 1.005–1.030)
Urobilinogen, UA: 0.2 mg/dL (ref 0.0–1.0)

## 2013-11-26 NOTE — Progress Notes (Signed)
NST/AFI/OBF

## 2013-11-26 NOTE — Progress Notes (Signed)
Fasting 81/80/116/90/84/105 2 hr PP B 119/150/77  L 119/110/150/ 77/149/120 D 164/140/100/121 Increased Glyburide to 2.5 mg po bid, reevaluate next week. Emphasized diet adherence, hypoglycemic control.  NST performed today was reviewed and was found to be reactive.  AFI normal at 12.5 cm.  Continue recommended antenatal testing and prenatal care. No other complaints or concerns.  Labor and fetal movement precautions reviewed.

## 2013-11-26 NOTE — Patient Instructions (Signed)
Return to clinic for any obstetric concerns or go to [facility] for evaluation  

## 2013-11-29 ENCOUNTER — Ambulatory Visit (INDEPENDENT_AMBULATORY_CARE_PROVIDER_SITE_OTHER): Payer: Medicaid Other | Admitting: *Deleted

## 2013-11-29 VITALS — BP 123/76 | HR 122

## 2013-11-29 DIAGNOSIS — O24419 Gestational diabetes mellitus in pregnancy, unspecified control: Secondary | ICD-10-CM

## 2013-11-29 NOTE — Progress Notes (Signed)
NST reactive on 11/29/13

## 2013-12-03 ENCOUNTER — Ambulatory Visit (INDEPENDENT_AMBULATORY_CARE_PROVIDER_SITE_OTHER): Payer: Medicaid Other | Admitting: Obstetrics & Gynecology

## 2013-12-03 VITALS — BP 127/63 | HR 108 | Temp 98.4°F | Wt 149.6 lb

## 2013-12-03 DIAGNOSIS — O24419 Gestational diabetes mellitus in pregnancy, unspecified control: Secondary | ICD-10-CM

## 2013-12-03 DIAGNOSIS — O0993 Supervision of high risk pregnancy, unspecified, third trimester: Secondary | ICD-10-CM

## 2013-12-03 DIAGNOSIS — O09523 Supervision of elderly multigravida, third trimester: Secondary | ICD-10-CM

## 2013-12-03 DIAGNOSIS — O09513 Supervision of elderly primigravida, third trimester: Secondary | ICD-10-CM

## 2013-12-03 LAB — US OB FOLLOW UP

## 2013-12-03 LAB — POCT URINALYSIS DIP (DEVICE)
Bilirubin Urine: NEGATIVE
GLUCOSE, UA: 500 mg/dL — AB
Hgb urine dipstick: NEGATIVE
Ketones, ur: NEGATIVE mg/dL
Leukocytes, UA: NEGATIVE
Nitrite: NEGATIVE
PROTEIN: NEGATIVE mg/dL
SPECIFIC GRAVITY, URINE: 1.01 (ref 1.005–1.030)
UROBILINOGEN UA: 0.2 mg/dL (ref 0.0–1.0)
pH: 6.5 (ref 5.0–8.0)

## 2013-12-03 MED ORDER — GLYBURIDE 2.5 MG PO TABS: ORAL_TABLET | ORAL | Status: DC

## 2013-12-03 NOTE — Progress Notes (Signed)
Fasting 90/97/103/87/139/96/97 2 hr PP B 99-120 L 87-120, 130  D 86-120 Increased Glyburide to 2.5 mg po am and 5 mg qhs, reevaluate next week. Emphasized diet adherence, hypoglycemic control.  NST performed today was reviewed and was found to be reactive. AFI normal at 16.4 cm. Continue recommended antenatal testing and prenatal care.  Unsure about postpartum contraception, counseling done. Will follow up next visit. No other complaints or concerns. Labor and fetal movement precautions reviewed.

## 2013-12-03 NOTE — Patient Instructions (Addendum)
Return to clinic for any obstetric concerns or go to [facility] for evaluation Contraception Choices Contraception (birth control) is the use of any methods or devices to prevent pregnancy. Below are some methods to help avoid pregnancy. HORMONAL METHODS   Contraceptive implant. This is a thin, plastic tube containing progesterone hormone. It does not contain estrogen hormone. Your health care provider inserts the tube in the inner part of the upper arm. The tube can remain in place for up to 3 years. After 3 years, the implant must be removed. The implant prevents the ovaries from releasing an egg (ovulation), thickens the cervical mucus to prevent sperm from entering the uterus, and thins the lining of the inside of the uterus.  Progesterone-only injections. These injections are given every 3 months by your health care provider to prevent pregnancy. This synthetic progesterone hormone stops the ovaries from releasing eggs. It also thickens cervical mucus and changes the uterine lining. This makes it harder for sperm to survive in the uterus.  Birth control pills. These pills contain estrogen and progesterone hormone. They work by preventing the ovaries from releasing eggs (ovulation). They also cause the cervical mucus to thicken, preventing the sperm from entering the uterus. Birth control pills are prescribed by a health care provider.Birth control pills can also be used to treat heavy periods.  Minipill. This type of birth control pill contains only the progesterone hormone. They are taken every day of each month and must be prescribed by your health care provider.  Birth control patch. The patch contains hormones similar to those in birth control pills. It must be changed once a week and is prescribed by a health care provider.  Vaginal ring. The ring contains hormones similar to those in birth control pills. It is left in the vagina for 3 weeks, removed for 1 week, and then a new one is put back in  place. The patient must be comfortable inserting and removing the ring from the vagina.A health care provider's prescription is necessary.  Emergency contraception. Emergency contraceptives prevent pregnancy after unprotected sexual intercourse. This pill can be taken right after sex or up to 5 days after unprotected sex. It is most effective the sooner you take the pills after having sexual intercourse. Most emergency contraceptive pills are available without a prescription. Check with your pharmacist. Do not use emergency contraception as your only form of birth control. BARRIER METHODS   Female condom. This is a thin sheath (latex or rubber) that is worn over the penis during sexual intercourse. It can be used with spermicide to increase effectiveness.  Female condom. This is a soft, loose-fitting sheath that is put into the vagina before sexual intercourse.  Diaphragm. This is a soft, latex, dome-shaped barrier that must be fitted by a health care provider. It is inserted into the vagina, along with a spermicidal jelly. It is inserted before intercourse. The diaphragm should be left in the vagina for 6 to 8 hours after intercourse.  Cervical cap. This is a round, soft, latex or plastic cup that fits over the cervix and must be fitted by a health care provider. The cap can be left in place for up to 48 hours after intercourse.  Sponge. This is a soft, circular piece of polyurethane foam. The sponge has spermicide in it. It is inserted into the vagina after wetting it and before sexual intercourse.  Spermicides. These are chemicals that kill or block sperm from entering the cervix and uterus. They come in the  form of creams, jellies, suppositories, foam, or tablets. They do not require a prescription. They are inserted into the vagina with an applicator before having sexual intercourse. The process must be repeated every time you have sexual intercourse. INTRAUTERINE CONTRACEPTION  Intrauterine  device (IUD). This is a T-shaped device that is put in a woman's uterus during a menstrual period to prevent pregnancy. There are 2 types:  Copper IUD. This type of IUD is wrapped in copper wire and is placed inside the uterus. Copper makes the uterus and fallopian tubes produce a fluid that kills sperm. It can stay in place for 10 years.  Hormone IUD. This type of IUD contains the hormone progestin (synthetic progesterone). The hormone thickens the cervical mucus and prevents sperm from entering the uterus, and it also thins the uterine lining to prevent implantation of a fertilized egg. The hormone can weaken or kill the sperm that get into the uterus. It can stay in place for 3-5 years, depending on which type of IUD is used. PERMANENT METHODS OF CONTRACEPTION  Female tubal ligation. This is when the woman's fallopian tubes are surgically sealed, tied, or blocked to prevent the egg from traveling to the uterus.  Hysteroscopic sterilization. This involves placing a small coil or insert into each fallopian tube. Your doctor uses a technique called hysteroscopy to do the procedure. The device causes scar tissue to form. This results in permanent blockage of the fallopian tubes, so the sperm cannot fertilize the egg. It takes about 3 months after the procedure for the tubes to become blocked. You must use another form of birth control for these 3 months.  Female sterilization. This is when the female has the tubes that carry sperm tied off (vasectomy).This blocks sperm from entering the vagina during sexual intercourse. After the procedure, the man can still ejaculate fluid (semen). NATURAL PLANNING METHODS  Natural family planning. This is not having sexual intercourse or using a barrier method (condom, diaphragm, cervical cap) on days the woman could become pregnant.  Calendar method. This is keeping track of the length of each menstrual cycle and identifying when you are fertile.  Ovulation method.  This is avoiding sexual intercourse during ovulation.  Symptothermal method. This is avoiding sexual intercourse during ovulation, using a thermometer and ovulation symptoms.  Post-ovulation method. This is timing sexual intercourse after you have ovulated. Regardless of which type or method of contraception you choose, it is important that you use condoms to protect against the transmission of sexually transmitted infections (STIs). Talk with your health care provider about which form of contraception is most appropriate for you. Document Released: 02/01/2005 Document Revised: 02/06/2013 Document Reviewed: 07/27/2012 [facility] Patient Information 2015 [place], [place]. This information is not intended to replace advice given to you by your health care provider. Make sure you discuss any questions you have with your health care provider.

## 2013-12-06 ENCOUNTER — Ambulatory Visit (INDEPENDENT_AMBULATORY_CARE_PROVIDER_SITE_OTHER): Payer: Medicaid Other | Admitting: *Deleted

## 2013-12-06 VITALS — BP 118/67 | HR 112

## 2013-12-06 DIAGNOSIS — O24419 Gestational diabetes mellitus in pregnancy, unspecified control: Secondary | ICD-10-CM

## 2013-12-06 NOTE — Progress Notes (Signed)
10/22- NST reviewed and reactive

## 2013-12-10 ENCOUNTER — Ambulatory Visit (INDEPENDENT_AMBULATORY_CARE_PROVIDER_SITE_OTHER): Payer: Medicaid Other | Admitting: Obstetrics & Gynecology

## 2013-12-10 VITALS — BP 115/63 | HR 103 | Wt 151.2 lb

## 2013-12-10 DIAGNOSIS — O0993 Supervision of high risk pregnancy, unspecified, third trimester: Secondary | ICD-10-CM

## 2013-12-10 DIAGNOSIS — O24419 Gestational diabetes mellitus in pregnancy, unspecified control: Secondary | ICD-10-CM

## 2013-12-10 LAB — US OB FOLLOW UP

## 2013-12-10 LAB — POCT URINALYSIS DIP (DEVICE)
Bilirubin Urine: NEGATIVE
Glucose, UA: NEGATIVE mg/dL
HGB URINE DIPSTICK: NEGATIVE
Ketones, ur: NEGATIVE mg/dL
Leukocytes, UA: NEGATIVE
NITRITE: NEGATIVE
PH: 6.5 (ref 5.0–8.0)
Protein, ur: NEGATIVE mg/dL
SPECIFIC GRAVITY, URINE: 1.015 (ref 1.005–1.030)
Urobilinogen, UA: 0.2 mg/dL (ref 0.0–1.0)

## 2013-12-10 NOTE — Progress Notes (Signed)
**  Pt states she has been taking Glyburide 2 tablets in the am and 1 tablet at bedtime. This is different than prescribed. She was "mixed up".

## 2013-12-10 NOTE — Progress Notes (Addendum)
Taking 2 in AM and 1 PM, control is good so will continue, FBS only one <95 and PP <120  NST reactive

## 2013-12-10 NOTE — Patient Instructions (Signed)
Third Trimester of Pregnancy The third trimester is from week 29 through week 42, months 7 through 9. The third trimester is a time when the fetus is growing rapidly. At the end of the ninth month, the fetus is about 20 inches in length and weighs 6-10 pounds.  BODY CHANGES Your body goes through many changes during pregnancy. The changes vary from woman to woman.   Your weight will continue to increase. You can expect to gain 25-35 pounds (11-16 kg) by the end of the pregnancy.  You may begin to get stretch marks on your hips, abdomen, and breasts.  You may urinate more often because the fetus is moving lower into your pelvis and pressing on your bladder.  You may develop or continue to have heartburn as a result of your pregnancy.  You may develop constipation because certain hormones are causing the muscles that push waste through your intestines to slow down.  You may develop hemorrhoids or swollen, bulging veins (varicose veins).  You may have pelvic pain because of the weight gain and pregnancy hormones relaxing your joints between the bones in your pelvis. Backaches may result from overexertion of the muscles supporting your posture.  You may have changes in your hair. These can include thickening of your hair, rapid growth, and changes in texture. Some women also have hair loss during or after pregnancy, or hair that feels dry or thin. Your hair will most likely return to normal after your baby is born.  Your breasts will continue to grow and be tender. A yellow discharge may leak from your breasts called colostrum.  Your belly button may stick out.  You may feel short of breath because of your expanding uterus.  You may notice the fetus "dropping," or moving lower in your abdomen.  You may have a bloody mucus discharge. This usually occurs a few days to a week before labor begins.  Your cervix becomes thin and soft (effaced) near your due date. WHAT TO EXPECT AT YOUR PRENATAL  EXAMS  You will have prenatal exams every 2 weeks until week 36. Then, you will have weekly prenatal exams. During a routine prenatal visit:  You will be weighed to make sure you and the fetus are growing normally.  Your blood pressure is taken.  Your abdomen will be measured to track your baby's growth.  The fetal heartbeat will be listened to.  Any test results from the previous visit will be discussed.  You may have a cervical check near your due date to see if you have effaced. At around 36 weeks, your caregiver will check your cervix. At the same time, your caregiver will also perform a test on the secretions of the vaginal tissue. This test is to determine if a type of bacteria, Group B streptococcus, is present. Your caregiver will explain this further. Your caregiver may ask you:  What your birth plan is.  How you are feeling.  If you are feeling the baby move.  If you have had any abnormal symptoms, such as leaking fluid, bleeding, severe headaches, or abdominal cramping.  If you have any questions. Other tests or screenings that may be performed during your third trimester include:  Blood tests that check for low iron levels (anemia).  Fetal testing to check the health, activity level, and growth of the fetus. Testing is done if you have certain medical conditions or if there are problems during the pregnancy. FALSE LABOR You may feel small, irregular contractions that   eventually go away. These are called Braxton Hicks contractions, or false labor. Contractions may last for hours, days, or even weeks before true labor sets in. If contractions come at regular intervals, intensify, or become painful, it is best to be seen by your caregiver.  SIGNS OF LABOR   Menstrual-like cramps.  Contractions that are 5 minutes apart or less.  Contractions that start on the top of the uterus and spread down to the lower abdomen and back.  A sense of increased pelvic pressure or back  pain.  A watery or bloody mucus discharge that comes from the vagina. If you have any of these signs before the 37th week of pregnancy, call your caregiver right away. You need to go to the hospital to get checked immediately. HOME CARE INSTRUCTIONS   Avoid all smoking, herbs, alcohol, and unprescribed drugs. These chemicals affect the formation and growth of the baby.  Follow your caregiver's instructions regarding medicine use. There are medicines that are either safe or unsafe to take during pregnancy.  Exercise only as directed by your caregiver. Experiencing uterine cramps is a good sign to stop exercising.  Continue to eat regular, healthy meals.  Wear a good support bra for breast tenderness.  Do not use hot tubs, steam rooms, or saunas.  Wear your seat belt at all times when driving.  Avoid raw meat, uncooked cheese, cat litter boxes, and soil used by cats. These carry germs that can cause birth defects in the baby.  Take your prenatal vitamins.  Try taking a stool softener (if your caregiver approves) if you develop constipation. Eat more high-fiber foods, such as fresh vegetables or fruit and whole grains. Drink plenty of fluids to keep your urine clear or pale yellow.  Take warm sitz baths to soothe any pain or discomfort caused by hemorrhoids. Use hemorrhoid cream if your caregiver approves.  If you develop varicose veins, wear support hose. Elevate your feet for 15 minutes, 3-4 times a day. Limit salt in your diet.  Avoid heavy lifting, wear low heal shoes, and practice good posture.  Rest a lot with your legs elevated if you have leg cramps or low back pain.  Visit your dentist if you have not gone during your pregnancy. Use a soft toothbrush to brush your teeth and be gentle when you floss.  A sexual relationship may be continued unless your caregiver directs you otherwise.  Do not travel far distances unless it is absolutely necessary and only with the approval  of your caregiver.  Take prenatal classes to understand, practice, and ask questions about the labor and delivery.  Make a trial run to the hospital.  Pack your hospital bag.  Prepare the baby's nursery.  Continue to go to all your prenatal visits as directed by your caregiver. SEEK MEDICAL CARE IF:  You are unsure if you are in labor or if your water has broken.  You have dizziness.  You have mild pelvic cramps, pelvic pressure, or nagging pain in your abdominal area.  You have persistent nausea, vomiting, or diarrhea.  You have a bad smelling vaginal discharge.  You have pain with urination. SEEK IMMEDIATE MEDICAL CARE IF:   You have a fever.  You are leaking fluid from your vagina.  You have spotting or bleeding from your vagina.  You have severe abdominal cramping or pain.  You have rapid weight loss or gain.  You have shortness of breath with chest pain.  You notice sudden or extreme swelling   of your face, hands, ankles, feet, or legs.  You have not felt your baby move in over an hour.  You have severe headaches that do not go away with medicine.  You have vision changes. Document Released: 01/26/2001 Document Revised: 02/06/2013 Document Reviewed: 04/04/2012 [employer] Patient Information 2015 [employer]. This information is not intended to replace advice given to you by your health care provider. Make sure you discuss any questions you have with your health care provider.  

## 2013-12-13 ENCOUNTER — Ambulatory Visit (INDEPENDENT_AMBULATORY_CARE_PROVIDER_SITE_OTHER): Payer: Medicaid Other | Admitting: *Deleted

## 2013-12-13 VITALS — BP 118/64 | HR 98

## 2013-12-13 DIAGNOSIS — O24419 Gestational diabetes mellitus in pregnancy, unspecified control: Secondary | ICD-10-CM

## 2013-12-13 MED ORDER — GLYBURIDE 2.5 MG PO TABS: ORAL_TABLET | ORAL | Status: DC

## 2013-12-14 NOTE — Progress Notes (Signed)
NST reviewed and reactive.  

## 2013-12-17 ENCOUNTER — Encounter (HOSPITAL_COMMUNITY): Payer: Self-pay

## 2013-12-17 ENCOUNTER — Ambulatory Visit (INDEPENDENT_AMBULATORY_CARE_PROVIDER_SITE_OTHER): Payer: Medicaid Other | Admitting: Family Medicine

## 2013-12-17 VITALS — BP 112/69 | HR 89 | Wt 157.3 lb

## 2013-12-17 DIAGNOSIS — O24419 Gestational diabetes mellitus in pregnancy, unspecified control: Secondary | ICD-10-CM

## 2013-12-17 LAB — US OB FOLLOW UP

## 2013-12-17 LAB — OB RESULTS CONSOLE GBS: GBS: POSITIVE

## 2013-12-17 LAB — OB RESULTS CONSOLE GC/CHLAMYDIA
CHLAMYDIA, DNA PROBE: NEGATIVE
GC PROBE AMP, GENITAL: NEGATIVE

## 2013-12-17 NOTE — Patient Instructions (Signed)
Gestational Diabetes Mellitus Gestational diabetes mellitus, often simply referred to as gestational diabetes, is a type of diabetes that some women develop during pregnancy. In gestational diabetes, the pancreas does not make enough insulin (a hormone), the cells are less responsive to the insulin that is made (insulin resistance), or both.Normally, insulin moves sugars from food into the tissue cells. The tissue cells use the sugars for energy. The lack of insulin or the lack of normal response to insulin causes excess sugars to build up in the blood instead of going into the tissue cells. As a result, high blood sugar (hyperglycemia) develops. The effect of high sugar (glucose) levels can cause many problems.  RISK FACTORS You have an increased chance of developing gestational diabetes if you have a family history of diabetes and also have one or more of the following risk factors:  A body mass index over 30 (obesity).  A previous pregnancy with gestational diabetes.  An older age at the time of pregnancy. If blood glucose levels are kept in the normal range during pregnancy, women can have a healthy pregnancy. If your blood glucose levels are not well controlled, there may be risks to you, your unborn baby (fetus), your labor and delivery, or your newborn baby.  SYMPTOMS  If symptoms are experienced, they are much like symptoms you would normally expect during pregnancy. The symptoms of gestational diabetes include:   Increased thirst (polydipsia).  Increased urination (polyuria).  Increased urination during the night (nocturia).  Weight loss. This weight loss may be rapid.  Frequent, recurring infections.  Tiredness (fatigue).  Weakness.  Vision changes, such as blurred vision.  Fruity smell to your breath.  Abdominal pain. DIAGNOSIS Diabetes is diagnosed when blood glucose levels are increased. Your blood glucose level may be checked by one or more of the following blood  tests:  A fasting blood glucose test. You will not be allowed to eat for at least 8 hours before a blood sample is taken.  A random blood glucose test. Your blood glucose is checked at any time of the day regardless of when you ate.  A hemoglobin A1c blood glucose test. A hemoglobin A1c test provides information about blood glucose control over the previous 3 months.  An oral glucose tolerance test (OGTT). Your blood glucose is measured after you have not eaten (fasted) for 1-3 hours and then after you drink a glucose-containing beverage. Since the hormones that cause insulin resistance are highest at about 24-28 weeks of a pregnancy, an OGTT is usually performed during that time. If you have risk factors for gestational diabetes, your health care provider may test you for gestational diabetes earlier than 24 weeks of pregnancy. TREATMENT   You will need to take diabetes medicine or insulin daily to keep blood glucose levels in the desired range.  You will need to match insulin dosing with exercise and healthy food choices. The treatment goal is to maintain the before-meal (preprandial), bedtime, and overnight blood glucose level at 60-99 mg/dL during pregnancy. The treatment goal is to further maintain peak after-meal blood sugar (postprandial glucose) level at 100-140 mg/dL. HOME CARE INSTRUCTIONS   Have your hemoglobin A1c level checked twice a year.  Perform daily blood glucose monitoring as directed by your health care provider. It is common to perform frequent blood glucose monitoring.  Monitor urine ketones when you are ill and as directed by your health care provider.  Take your diabetes medicine and insulin as directed by your health care provider   to maintain your blood glucose level in the desired range.  Never run out of diabetes medicine or insulin. It is needed every day.  Adjust insulin based on your intake of carbohydrates. Carbohydrates can raise blood glucose levels but  need to be included in your diet. Carbohydrates provide vitamins, minerals, and fiber which are an essential part of a healthy diet. Carbohydrates are found in fruits, vegetables, whole grains, dairy products, legumes, and foods containing added sugars.  Eat healthy foods. Alternate 3 meals with 3 snacks.  Maintain a healthy weight gain. The usual total expected weight gain varies according to your prepregnancy body mass index (BMI).  Carry a medical alert card or wear your medical alert jewelry.  Carry a 15-gram carbohydrate snack with you at all times to treat low blood glucose (hypoglycemia). Some examples of 15-gram carbohydrate snacks include:  Glucose tablets, 3 or 4.  Glucose gel, 15-gram tube.  Raisins, 2 tablespoons (24 g).  Jelly beans, 6.  Animal crackers, 8.  Fruit juice, regular soda, or low-fat milk, 4 ounces (120 mL).  Gummy treats, 9.  Recognize hypoglycemia. Hypoglycemia during pregnancy occurs with blood glucose levels of 60 mg/dL and below. The risk for hypoglycemia increases when fasting or skipping meals, during or after intense exercise, and during sleep. Hypoglycemia symptoms can include:  Tremors or shakes.  Decreased ability to concentrate.  Sweating.  Increased heart rate.  Headache.  Dry mouth.  Hunger.  Irritability.  Anxiety.  Restless sleep.  Altered speech or coordination.  Confusion.  Treat hypoglycemia promptly. If you are alert and able to safely swallow, follow the 15:15 rule:  Take 15-20 grams of rapid-acting glucose or carbohydrate. Rapid-acting options include glucose gel, glucose tablets, or 4 ounces (120 mL) of fruit juice, regular soda, or low-fat milk.  Check your blood glucose level 15 minutes after taking the glucose.  Take 15-20 grams more of glucose if the repeat blood glucose level is still 70 mg/dL or below.  Eat a meal or snack within 1 hour once blood glucose levels return to normal.  Be alert to polyuria  (excess urination) and polydipsia (excess thirst) which are early signs of hyperglycemia. An early awareness of hyperglycemia allows for prompt treatment. Treat hyperglycemia as directed by your health care provider.  Engage in at least 30 minutes of physical activity a day or as directed by your health care provider. Ten minutes of physical activity timed 30 minutes after each meal is encouraged to control postprandial blood glucose levels.  Adjust your insulin dosing and food intake as needed if you start a new exercise or sport.  Follow your sick-day plan at any time you are unable to eat or drink as usual.  Avoid tobacco and alcohol use.  Keep all follow-up visits as directed by your health care provider.  Follow the advice of your health care provider regarding your prenatal and post-delivery (postpartum) appointments, meal planning, exercise, medicines, vitamins, blood tests, other medical tests, and physical activities.  Perform daily skin and foot care. Examine your skin and feet daily for cuts, bruises, redness, nail problems, bleeding, blisters, or sores.  Brush your teeth and gums at least twice a day and floss at least once a day. Follow up with your dentist regularly.  Schedule an eye exam during the first trimester of your pregnancy or as directed by your health care provider.  Share your diabetes management plan with your workplace or school.  Stay up-to-date with immunizations.  Learn to manage stress.    Obtain ongoing diabetes education and support as needed.  Learn about and consider breastfeeding your baby.  You should have your blood sugar level checked 6-12 weeks after delivery. This is done with an oral glucose tolerance test (OGTT). SEEK MEDICAL CARE IF:   You are unable to eat food or drink fluids for more than 6 hours.  You have nausea and vomiting for more than 6 hours.  You have a blood glucose level of 200 mg/dL and you have ketones in your  urine.  There is a change in mental status.  You develop vision problems.  You have a persistent headache.  You have upper abdominal pain or discomfort.  You develop an additional serious illness.  You have diarrhea for more than 6 hours.  You have been sick or have had a fever for a couple of days and are not getting better. SEEK IMMEDIATE MEDICAL CARE IF:   You have difficulty breathing.  You no longer feel the baby moving.  You are bleeding or have discharge from your vagina.  You start having premature contractions or labor. MAKE SURE YOU:  Understand these instructions.  Will watch your condition.  Will get help right away if you are not doing well or get worse. Document Released: 05/10/2000 Document Revised: 06/18/2013 Document Reviewed: 08/31/2011 [employer] Patient Information 2015 [employer]. This information is not intended to replace advice given to you by your health care provider. Make sure you discuss any questions you have with your health care provider.  Breastfeeding Deciding to breastfeed is one of the best choices you can make for you and your baby. A change in hormones during pregnancy causes your breast tissue to grow and increases the number and size of your milk ducts. These hormones also allow proteins, sugars, and fats from your blood supply to make breast milk in your milk-producing glands. Hormones prevent breast milk from being released before your baby is born as well as prompt milk flow after birth. Once breastfeeding has begun, thoughts of your baby, as well as his or her sucking or crying, can stimulate the release of milk from your milk-producing glands.  BENEFITS OF BREASTFEEDING For Your Baby  Your first milk (colostrum) helps your baby's digestive system function better.   There are antibodies in your milk that help your baby fight off infections.   Your baby has a lower incidence of asthma, allergies, and sudden infant death  syndrome.   The nutrients in breast milk are better for your baby than infant formulas and are designed uniquely for your baby's needs.   Breast milk improves your baby's brain development.   Your baby is less likely to develop other conditions, such as childhood obesity, asthma, or type 2 diabetes mellitus.  For You   Breastfeeding helps to create a very special bond between you and your baby.   Breastfeeding is convenient. Breast milk is always available at the correct temperature and costs nothing.   Breastfeeding helps to burn calories and helps you lose the weight gained during pregnancy.   Breastfeeding makes your uterus contract to its prepregnancy size faster and slows bleeding (lochia) after you give birth.   Breastfeeding helps to lower your risk of developing type 2 diabetes mellitus, osteoporosis, and breast or ovarian cancer later in life. SIGNS THAT YOUR BABY IS HUNGRY Early Signs of Hunger  Increased alertness or activity.  Stretching.  Movement of the head from side to side.  Movement of the head and opening of the   mouth when the corner of the mouth or cheek is stroked (rooting).  Increased sucking sounds, smacking lips, cooing, sighing, or squeaking.  Hand-to-mouth movements.  Increased sucking of fingers or hands. Late Signs of Hunger  Fussing.  Intermittent crying. Extreme Signs of Hunger Signs of extreme hunger will require calming and consoling before your baby will be able to breastfeed successfully. Do not wait for the following signs of extreme hunger to occur before you initiate breastfeeding:   Restlessness.  A loud, strong cry.   Screaming. BREASTFEEDING BASICS Breastfeeding Initiation  Find a comfortable place to sit or lie down, with your neck and back well supported.  Place a pillow or rolled up blanket under your baby to bring him or her to the level of your breast (if you are seated). Nursing pillows are specially designed  to help support your arms and your baby while you breastfeed.  Make sure that your baby's abdomen is facing your abdomen.   Gently massage your breast. With your fingertips, massage from your chest wall toward your nipple in a circular motion. This encourages milk flow. You may need to continue this action during the feeding if your milk flows slowly.  Support your breast with 4 fingers underneath and your thumb above your nipple. Make sure your fingers are well away from your nipple and your baby's mouth.   Stroke your baby's lips gently with your finger or nipple.   When your baby's mouth is open wide enough, quickly bring your baby to your breast, placing your entire nipple and as much of the colored area around your nipple (areola) as possible into your baby's mouth.   More areola should be visible above your baby's upper lip than below the lower lip.   Your baby's tongue should be between his or her lower gum and your breast.   Ensure that your baby's mouth is correctly positioned around your nipple (latched). Your baby's lips should create a seal on your breast and be turned out (everted).  It is common for your baby to suck about 2-3 minutes in order to start the flow of breast milk. Latching Teaching your baby how to latch on to your breast properly is very important. An improper latch can cause nipple pain and decreased milk supply for you and poor weight gain in your baby. Also, if your baby is not latched onto your nipple properly, he or she may swallow some air during feeding. This can make your baby fussy. Burping your baby when you switch breasts during the feeding can help to get rid of the air. However, teaching your baby to latch on properly is still the best way to prevent fussiness from swallowing air while breastfeeding. Signs that your baby has successfully latched on to your nipple:    Silent tugging or silent sucking, without causing you pain.   Swallowing  heard between every 3-4 sucks.    Muscle movement above and in front of his or her ears while sucking.  Signs that your baby has not successfully latched on to nipple:   Sucking sounds or smacking sounds from your baby while breastfeeding.  Nipple pain. If you think your baby has not latched on correctly, slip your finger into the corner of your baby's mouth to break the suction and place it between your baby's gums. Attempt breastfeeding initiation again. Signs of Successful Breastfeeding Signs from your baby:   A gradual decrease in the number of sucks or complete cessation of sucking.     Falling asleep.   Relaxation of his or her body.   Retention of a small amount of milk in his or her mouth.   Letting go of your breast by himself or herself. Signs from you:  Breasts that have increased in firmness, weight, and size 1-3 hours after feeding.   Breasts that are softer immediately after breastfeeding.  Increased milk volume, as well as a change in milk consistency and color by the fifth day of breastfeeding.   Nipples that are not sore, cracked, or bleeding. Signs That Your Baby is Getting Enough Milk  Wetting at least 3 diapers in a 24-hour period. The urine should be clear and pale yellow by age 5 days.  At least 3 stools in a 24-hour period by age 5 days. The stool should be soft and yellow.  At least 3 stools in a 24-hour period by age 7 days. The stool should be seedy and yellow.  No loss of weight greater than 10% of birth weight during the first 3 days of age.  Average weight gain of 4-7 ounces (113-198 g) per week after age 4 days.  Consistent daily weight gain by age 5 days, without weight loss after the age of 2 weeks. After a feeding, your baby may spit up a small amount. This is common. BREASTFEEDING FREQUENCY AND DURATION Frequent feeding will help you make more milk and can prevent sore nipples and breast engorgement. Breastfeed when you feel the  need to reduce the fullness of your breasts or when your baby shows signs of hunger. This is called "breastfeeding on demand." Avoid introducing a pacifier to your baby while you are working to establish breastfeeding (the first 4-6 weeks after your baby is born). After this time you may choose to use a pacifier. Research has shown that pacifier use during the first year of a baby's life decreases the risk of sudden infant death syndrome (SIDS). Allow your baby to feed on each breast as long as he or she wants. Breastfeed until your baby is finished feeding. When your baby unlatches or falls asleep while feeding from the first breast, offer the second breast. Because newborns are often sleepy in the first few weeks of life, you may need to awaken your baby to get him or her to feed. Breastfeeding times will vary from baby to baby. However, the following rules can serve as a guide to help you ensure that your baby is properly fed:  Newborns (babies 4 weeks of age or younger) may breastfeed every 1-3 hours.  Newborns should not go longer than 3 hours during the day or 5 hours during the night without breastfeeding.  You should breastfeed your baby a minimum of 8 times in a 24-hour period until you begin to introduce solid foods to your baby at around 6 months of age. BREAST MILK PUMPING Pumping and storing breast milk allows you to ensure that your baby is exclusively fed your breast milk, even at times when you are unable to breastfeed. This is especially important if you are going back to work while you are still breastfeeding or when you are not able to be present during feedings. Your lactation consultant can give you guidelines on how long it is safe to store breast milk.  A breast pump is a machine that allows you to pump milk from your breast into a sterile bottle. The pumped breast milk can then be stored in a refrigerator or freezer. Some breast pumps are operated by   hand, while others use  electricity. Ask your lactation consultant which type will work best for you. Breast pumps can be purchased, but some hospitals and breastfeeding support groups lease breast pumps on a monthly basis. A lactation consultant can teach you how to hand express breast milk, if you prefer not to use a pump.  CARING FOR YOUR BREASTS WHILE YOU BREASTFEED Nipples can become dry, cracked, and sore while breastfeeding. The following recommendations can help keep your breasts moisturized and healthy:  Avoid using soap on your nipples.   Wear a supportive bra. Although not required, special nursing bras and tank tops are designed to allow access to your breasts for breastfeeding without taking off your entire bra or top. Avoid wearing underwire-style bras or extremely tight bras.  Air dry your nipples for 3-4minutes after each feeding.   Use only cotton bra pads to absorb leaked breast milk. Leaking of breast milk between feedings is normal.   Use lanolin on your nipples after breastfeeding. Lanolin helps to maintain your skin's normal moisture barrier. If you use pure lanolin, you do not need to wash it off before feeding your baby again. Pure lanolin is not toxic to your baby. You may also hand express a few drops of breast milk and gently massage that milk into your nipples and allow the milk to air dry. In the first few weeks after giving birth, some women experience extremely full breasts (engorgement). Engorgement can make your breasts feel heavy, warm, and tender to the touch. Engorgement peaks within 3-5 days after you give birth. The following recommendations can help ease engorgement:  Completely empty your breasts while breastfeeding or pumping. You may want to start by applying warm, moist heat (in the shower or with warm water-soaked hand towels) just before feeding or pumping. This increases circulation and helps the milk flow. If your baby does not completely empty your breasts while  breastfeeding, pump any extra milk after he or she is finished.  Wear a snug bra (nursing or regular) or tank top for 1-2 days to signal your body to slightly decrease milk production.  Apply ice packs to your breasts, unless this is too uncomfortable for you.  Make sure that your baby is latched on and positioned properly while breastfeeding. If engorgement persists after 48 hours of following these recommendations, contact your health care provider or a lactation consultant. OVERALL HEALTH CARE RECOMMENDATIONS WHILE BREASTFEEDING  Eat healthy foods. Alternate between meals and snacks, eating 3 of each per day. Because what you eat affects your breast milk, some of the foods may make your baby more irritable than usual. Avoid eating these foods if you are sure that they are negatively affecting your baby.  Drink milk, fruit juice, and water to satisfy your thirst (about 10 glasses a day).   Rest often, relax, and continue to take your prenatal vitamins to prevent fatigue, stress, and anemia.  Continue breast self-awareness checks.  Avoid chewing and smoking tobacco.  Avoid alcohol and drug use. Some medicines that may be harmful to your baby can pass through breast milk. It is important to ask your health care provider before taking any medicine, including all over-the-counter and prescription medicine as well as vitamin and herbal supplements. It is possible to become pregnant while breastfeeding. If birth control is desired, ask your health care provider about options that will be safe for your baby. SEEK MEDICAL CARE IF:   You feel like you want to stop breastfeeding or have become   frustrated with breastfeeding.  You have painful breasts or nipples.  Your nipples are cracked or bleeding.  Your breasts are red, tender, or warm.  You have a swollen area on either breast.  You have a fever or chills.  You have nausea or vomiting.  You have drainage other than breast milk from  your nipples.  Your breasts do not become full before feedings by the fifth day after you give birth.  You feel sad and depressed.  Your baby is too sleepy to eat well.  Your baby is having trouble sleeping.   Your baby is wetting less than 3 diapers in a 24-hour period.  Your baby has less than 3 stools in a 24-hour period.  Your baby's skin or the white part of his or her eyes becomes yellow.   Your baby is not gaining weight by 5 days of age. SEEK IMMEDIATE MEDICAL CARE IF:   Your baby is overly tired (lethargic) and does not want to wake up and feed.  Your baby develops an unexplained fever. Document Released: 02/01/2005 Document Revised: 02/06/2013 Document Reviewed: 07/26/2012 [employer] Patient Information 2015 [employer]. This information is not intended to replace advice given to you by your health care provider. Make sure you discuss any questions you have with your health care provider.  

## 2013-12-17 NOTE — Progress Notes (Signed)
Cultures today BS are ok--few FBS are out, but skipping bedtime snack--advised to keep snack with protein, add walking 10 min post meals. Needs U/S at 38 wks for growth NST reviewed and reactive.

## 2013-12-18 LAB — GC/CHLAMYDIA PROBE AMP
CT Probe RNA: NEGATIVE
GC Probe RNA: NEGATIVE

## 2013-12-20 ENCOUNTER — Ambulatory Visit (INDEPENDENT_AMBULATORY_CARE_PROVIDER_SITE_OTHER): Payer: Medicaid Other | Admitting: *Deleted

## 2013-12-20 ENCOUNTER — Encounter: Payer: Self-pay | Admitting: *Deleted

## 2013-12-20 VITALS — BP 121/66 | HR 103

## 2013-12-20 DIAGNOSIS — O24419 Gestational diabetes mellitus in pregnancy, unspecified control: Secondary | ICD-10-CM

## 2013-12-20 NOTE — Progress Notes (Signed)
11/5 NST reviewed and reactive

## 2013-12-21 LAB — CULTURE, BETA STREP (GROUP B ONLY)

## 2013-12-23 ENCOUNTER — Encounter: Payer: Self-pay | Admitting: Family Medicine

## 2013-12-23 DIAGNOSIS — O99820 Streptococcus B carrier state complicating pregnancy: Secondary | ICD-10-CM | POA: Insufficient documentation

## 2013-12-24 ENCOUNTER — Ambulatory Visit (INDEPENDENT_AMBULATORY_CARE_PROVIDER_SITE_OTHER): Payer: Medicaid Other | Admitting: Obstetrics and Gynecology

## 2013-12-24 VITALS — BP 114/72 | HR 95 | Temp 98.1°F | Wt 154.8 lb

## 2013-12-24 DIAGNOSIS — O24419 Gestational diabetes mellitus in pregnancy, unspecified control: Secondary | ICD-10-CM

## 2013-12-24 DIAGNOSIS — O0993 Supervision of high risk pregnancy, unspecified, third trimester: Secondary | ICD-10-CM

## 2013-12-24 LAB — POCT URINALYSIS DIP (DEVICE)
Bilirubin Urine: NEGATIVE
GLUCOSE, UA: NEGATIVE mg/dL
Hgb urine dipstick: NEGATIVE
Ketones, ur: NEGATIVE mg/dL
Leukocytes, UA: NEGATIVE
NITRITE: NEGATIVE
Protein, ur: NEGATIVE mg/dL
SPECIFIC GRAVITY, URINE: 1.015 (ref 1.005–1.030)
UROBILINOGEN UA: 0.2 mg/dL (ref 0.0–1.0)
pH: 6 (ref 5.0–8.0)

## 2013-12-24 LAB — US OB FOLLOW UP

## 2013-12-24 NOTE — Progress Notes (Signed)
OBF/NST/AFI.  [place] for growth scheduled on 11/16.

## 2013-12-24 NOTE — Progress Notes (Signed)
#)   A2GDM - Still taking glburide 5 mg in AM and 2.5 mg in PM. Started eating cheesy crackers before bed at night, grilled cheese on wheat, tuna and crackers. AM - 100, 80, 98, 96, 100, 98, 87, 108, 96 PM- 81  - 181 (only 1 above goal) Given AM sugars only mildly above goal with starting to eat regular PM snacks, patient will cut out breads, crackers, etc and re-check sugars at f/up on Thursday. Understands if sugars still above goal, will need to go up on PM glyburide. NST and AFI review and reassuring.  Growth scan scheduled. #) PNC - GBS +, reviewed. #) MOC - undecided, wants to have periods, also not sure if she wants to be pregnant or not again. Discussing w/ FOB.  RTC Thurs

## 2013-12-27 ENCOUNTER — Ambulatory Visit (INDEPENDENT_AMBULATORY_CARE_PROVIDER_SITE_OTHER): Payer: Medicaid Other | Admitting: *Deleted

## 2013-12-27 VITALS — BP 121/62 | HR 108

## 2013-12-27 DIAGNOSIS — O24419 Gestational diabetes mellitus in pregnancy, unspecified control: Secondary | ICD-10-CM

## 2013-12-27 NOTE — Progress Notes (Signed)
NST reviewed and reactive.  Jeramiah Mccaughey L. Harraway-Smith, M.D., FACOG    

## 2013-12-31 ENCOUNTER — Ambulatory Visit (HOSPITAL_COMMUNITY)
Admission: RE | Admit: 2013-12-31 | Discharge: 2013-12-31 | Disposition: A | Discharge status: Home / Self Care | Payer: Medicaid Other | Source: Ambulatory Visit | Attending: Obstetrics & Gynecology | Admitting: Obstetrics & Gynecology

## 2013-12-31 ENCOUNTER — Other Ambulatory Visit: Payer: Self-pay | Admitting: Obstetrics & Gynecology

## 2013-12-31 ENCOUNTER — Ambulatory Visit (INDEPENDENT_AMBULATORY_CARE_PROVIDER_SITE_OTHER): Payer: Medicaid Other | Admitting: Family Medicine

## 2013-12-31 VITALS — BP 111/74 | HR 92 | Wt 157.1 lb

## 2013-12-31 DIAGNOSIS — O99323 Drug use complicating pregnancy, third trimester: Secondary | ICD-10-CM | POA: Diagnosis not present

## 2013-12-31 DIAGNOSIS — O24419 Gestational diabetes mellitus in pregnancy, unspecified control: Secondary | ICD-10-CM

## 2013-12-31 DIAGNOSIS — Z3A37 37 weeks gestation of pregnancy: Secondary | ICD-10-CM | POA: Diagnosis not present

## 2013-12-31 DIAGNOSIS — O09513 Supervision of elderly primigravida, third trimester: Secondary | ICD-10-CM | POA: Insufficient documentation

## 2013-12-31 DIAGNOSIS — O99333 Smoking (tobacco) complicating pregnancy, third trimester: Secondary | ICD-10-CM | POA: Diagnosis not present

## 2013-12-31 DIAGNOSIS — F1290 Cannabis use, unspecified, uncomplicated: Secondary | ICD-10-CM | POA: Insufficient documentation

## 2013-12-31 DIAGNOSIS — O24410 Gestational diabetes mellitus in pregnancy, diet controlled: Secondary | ICD-10-CM | POA: Diagnosis not present

## 2013-12-31 DIAGNOSIS — O0993 Supervision of high risk pregnancy, unspecified, third trimester: Secondary | ICD-10-CM

## 2013-12-31 DIAGNOSIS — O365990 Maternal care for other known or suspected poor fetal growth, unspecified trimester, not applicable or unspecified: Secondary | ICD-10-CM | POA: Insufficient documentation

## 2013-12-31 LAB — POCT URINALYSIS DIP (DEVICE)
Bilirubin Urine: NEGATIVE
GLUCOSE, UA: 250 mg/dL — AB
Hgb urine dipstick: NEGATIVE
Ketones, ur: NEGATIVE mg/dL
LEUKOCYTES UA: NEGATIVE
NITRITE: NEGATIVE
Protein, ur: NEGATIVE mg/dL
Specific Gravity, Urine: 1.02 (ref 1.005–1.030)
UROBILINOGEN UA: 0.2 mg/dL (ref 0.0–1.0)
pH: 6.5 (ref 5.0–8.0)

## 2013-12-31 NOTE — Progress Notes (Signed)
Reports lower back pain

## 2013-12-31 NOTE — Patient Instructions (Signed)
Third Trimester of Pregnancy The third trimester is from week 29 through week 42, months 7 through 9. The third trimester is a time when the fetus is growing rapidly. At the end of the ninth month, the fetus is about 20 inches in length and weighs 6-10 pounds.  BODY CHANGES Your body goes through many changes during pregnancy. The changes vary from woman to woman.   Your weight will continue to increase. You can expect to gain 25-35 pounds (11-16 kg) by the end of the pregnancy.  You may begin to get stretch marks on your hips, abdomen, and breasts.  You may urinate more often because the fetus is moving lower into your pelvis and pressing on your bladder.  You may develop or continue to have heartburn as a result of your pregnancy.  You may develop constipation because certain hormones are causing the muscles that push waste through your intestines to slow down.  You may develop hemorrhoids or swollen, bulging veins (varicose veins).  You may have pelvic pain because of the weight gain and pregnancy hormones relaxing your joints between the bones in your pelvis. Backaches may result from overexertion of the muscles supporting your posture.  You may have changes in your hair. These can include thickening of your hair, rapid growth, and changes in texture. Some women also have hair loss during or after pregnancy, or hair that feels dry or thin. Your hair will most likely return to normal after your baby is born.  Your breasts will continue to grow and be tender. A yellow discharge may leak from your breasts called colostrum.  Your belly button may stick out.  You may feel short of breath because of your expanding uterus.  You may notice the fetus "dropping," or moving lower in your abdomen.  You may have a bloody mucus discharge. This usually occurs a few days to a week before labor begins.  Your cervix becomes thin and soft (effaced) near your due date. WHAT TO EXPECT AT YOUR PRENATAL  EXAMS  You will have prenatal exams every 2 weeks until week 36. Then, you will have weekly prenatal exams. During a routine prenatal visit:  You will be weighed to make sure you and the fetus are growing normally.  Your blood pressure is taken.  Your abdomen will be measured to track your baby's growth.  The fetal heartbeat will be listened to.  Any test results from the previous visit will be discussed.  You may have a cervical check near your due date to see if you have effaced. At around 36 weeks, your caregiver will check your cervix. At the same time, your caregiver will also perform a test on the secretions of the vaginal tissue. This test is to determine if a type of bacteria, Group B streptococcus, is present. Your caregiver will explain this further. Your caregiver may ask you:  What your birth plan is.  How you are feeling.  If you are feeling the baby move.  If you have had any abnormal symptoms, such as leaking fluid, bleeding, severe headaches, or abdominal cramping.  If you have any questions. Other tests or screenings that may be performed during your third trimester include:  Blood tests that check for low iron levels (anemia).  Fetal testing to check the health, activity level, and growth of the fetus. Testing is done if you have certain medical conditions or if there are problems during the pregnancy. FALSE LABOR You may feel small, irregular contractions that   eventually go away. These are called Braxton Hicks contractions, or false labor. Contractions may last for hours, days, or even weeks before true labor sets in. If contractions come at regular intervals, intensify, or become painful, it is best to be seen by your caregiver.  SIGNS OF LABOR   Menstrual-like cramps.  Contractions that are 5 minutes apart or less.  Contractions that start on the top of the uterus and spread down to the lower abdomen and back.  A sense of increased pelvic pressure or back  pain.  A watery or bloody mucus discharge that comes from the vagina. If you have any of these signs before the 37th week of pregnancy, call your caregiver right away. You need to go to the hospital to get checked immediately. HOME CARE INSTRUCTIONS   Avoid all smoking, herbs, alcohol, and unprescribed drugs. These chemicals affect the formation and growth of the baby.  Follow your caregiver's instructions regarding medicine use. There are medicines that are either safe or unsafe to take during pregnancy.  Exercise only as directed by your caregiver. Experiencing uterine cramps is a good sign to stop exercising.  Continue to eat regular, healthy meals.  Wear a good support bra for breast tenderness.  Do not use hot tubs, steam rooms, or saunas.  Wear your seat belt at all times when driving.  Avoid raw meat, uncooked cheese, cat litter boxes, and soil used by cats. These carry germs that can cause birth defects in the baby.  Take your prenatal vitamins.  Try taking a stool softener (if your caregiver approves) if you develop constipation. Eat more high-fiber foods, such as fresh vegetables or fruit and whole grains. Drink plenty of fluids to keep your urine clear or pale yellow.  Take warm sitz baths to soothe any pain or discomfort caused by hemorrhoids. Use hemorrhoid cream if your caregiver approves.  If you develop varicose veins, wear support hose. Elevate your feet for 15 minutes, 3-4 times a day. Limit salt in your diet.  Avoid heavy lifting, wear low heal shoes, and practice good posture.  Rest a lot with your legs elevated if you have leg cramps or low back pain.  Visit your dentist if you have not gone during your pregnancy. Use a soft toothbrush to brush your teeth and be gentle when you floss.  A sexual relationship may be continued unless your caregiver directs you otherwise.  Do not travel far distances unless it is absolutely necessary and only with the approval  of your caregiver.  Take prenatal classes to understand, practice, and ask questions about the labor and delivery.  Make a trial run to the hospital.  Pack your hospital bag.  Prepare the baby's nursery.  Continue to go to all your prenatal visits as directed by your caregiver. SEEK MEDICAL CARE IF:  You are unsure if you are in labor or if your water has broken.  You have dizziness.  You have mild pelvic cramps, pelvic pressure, or nagging pain in your abdominal area.  You have persistent nausea, vomiting, or diarrhea.  You have a bad smelling vaginal discharge.  You have pain with urination. SEEK IMMEDIATE MEDICAL CARE IF:   You have a fever.  You are leaking fluid from your vagina.  You have spotting or bleeding from your vagina.  You have severe abdominal cramping or pain.  You have rapid weight loss or gain.  You have shortness of breath with chest pain.  You notice sudden or extreme swelling   of your face, hands, ankles, feet, or legs.  You have not felt your baby move in over an hour.  You have severe headaches that do not go away with medicine.  You have vision changes. Document Released: 01/26/2001 Document Revised: 02/06/2013 Document Reviewed: 04/04/2012 [employer] Patient Information 2015 [employer]. This information is not intended to replace advice given to you by your health care provider. Make sure you discuss any questions you have with your health care provider.  

## 2013-12-31 NOTE — Progress Notes (Signed)
[place] for growth done today.  Needs IOL scheduled - does not want 11/25

## 2013-12-31 NOTE — Progress Notes (Signed)
Forgot book this morning.  Fasting blood sugar 87. Reports fasting blood sugars between 80-90 Category 1 tracing with baseline in 140-150s.  Moderate variability, multiple accelerations, no decelerations. Induction scheduled for 11/25.

## 2013-12-31 NOTE — Progress Notes (Deleted)
US for growth done today 

## 2014-01-03 ENCOUNTER — Ambulatory Visit (INDEPENDENT_AMBULATORY_CARE_PROVIDER_SITE_OTHER): Payer: Medicaid Other | Admitting: *Deleted

## 2014-01-03 ENCOUNTER — Telehealth (HOSPITAL_COMMUNITY): Payer: Self-pay | Admitting: *Deleted

## 2014-01-03 VITALS — BP 122/61 | HR 114

## 2014-01-03 DIAGNOSIS — O24419 Gestational diabetes mellitus in pregnancy, unspecified control: Secondary | ICD-10-CM

## 2014-01-03 NOTE — Telephone Encounter (Signed)
Preadmission screen  

## 2014-01-03 NOTE — Progress Notes (Signed)
NST performed today was reviewed and was found to be reactive.  Continue recommended antenatal testing and prenatal care.  

## 2014-01-07 ENCOUNTER — Ambulatory Visit (INDEPENDENT_AMBULATORY_CARE_PROVIDER_SITE_OTHER): Payer: Medicaid Other | Admitting: Obstetrics and Gynecology

## 2014-01-07 ENCOUNTER — Encounter: Payer: Self-pay | Admitting: Obstetrics and Gynecology

## 2014-01-07 VITALS — BP 107/73 | HR 102 | Temp 97.7°F | Wt 159.7 lb

## 2014-01-07 DIAGNOSIS — Z720 Tobacco use: Secondary | ICD-10-CM

## 2014-01-07 DIAGNOSIS — O99820 Streptococcus B carrier state complicating pregnancy: Secondary | ICD-10-CM

## 2014-01-07 DIAGNOSIS — O09523 Supervision of elderly multigravida, third trimester: Secondary | ICD-10-CM

## 2014-01-07 DIAGNOSIS — O24419 Gestational diabetes mellitus in pregnancy, unspecified control: Secondary | ICD-10-CM

## 2014-01-07 DIAGNOSIS — F17200 Nicotine dependence, unspecified, uncomplicated: Secondary | ICD-10-CM

## 2014-01-07 DIAGNOSIS — Z22330 Carrier of Group B streptococcus: Secondary | ICD-10-CM

## 2014-01-07 DIAGNOSIS — O0993 Supervision of high risk pregnancy, unspecified, third trimester: Secondary | ICD-10-CM

## 2014-01-07 DIAGNOSIS — O365931 Maternal care for other known or suspected poor fetal growth, third trimester, fetus 1: Secondary | ICD-10-CM

## 2014-01-07 LAB — POCT URINALYSIS DIP (DEVICE)
BILIRUBIN URINE: NEGATIVE
Glucose, UA: NEGATIVE mg/dL
Ketones, ur: NEGATIVE mg/dL
LEUKOCYTES UA: NEGATIVE
NITRITE: NEGATIVE
Protein, ur: NEGATIVE mg/dL
Specific Gravity, Urine: 1.02 (ref 1.005–1.030)
Urobilinogen, UA: 0.2 mg/dL (ref 0.0–1.0)
pH: 5.5 (ref 5.0–8.0)

## 2014-01-07 LAB — US OB FOLLOW UP

## 2014-01-07 NOTE — Progress Notes (Signed)
Patient reports pelvic pressure and some braxton hicks contractions.  IOL scheduled on 11/25

## 2014-01-07 NOTE — Progress Notes (Signed)
Patient is doing well without complaints. FM/labor precautions reviewed. Patient scheduled for IOL on 11/25. CBG reviewed and all within range. Continue current glyburide regimen  NST reviewed and reactive

## 2014-01-09 ENCOUNTER — Inpatient Hospital Stay (HOSPITAL_COMMUNITY)
Admission: RE | Admit: 2014-01-09 | Discharge: 2014-01-12 | DRG: 766 | Disposition: A | Discharge status: Home / Self Care | Payer: Medicaid Other | Source: Ambulatory Visit | Attending: Obstetrics & Gynecology | Admitting: Obstetrics & Gynecology

## 2014-01-09 ENCOUNTER — Encounter (HOSPITAL_COMMUNITY): Payer: Self-pay

## 2014-01-09 VITALS — BP 119/59 | HR 88 | Temp 98.6°F | Resp 18 | Ht 61.0 in | Wt 159.0 lb

## 2014-01-09 DIAGNOSIS — O99824 Streptococcus B carrier state complicating childbirth: Secondary | ICD-10-CM | POA: Diagnosis present

## 2014-01-09 DIAGNOSIS — IMO0001 Reserved for inherently not codable concepts without codable children: Secondary | ICD-10-CM

## 2014-01-09 DIAGNOSIS — O99334 Smoking (tobacco) complicating childbirth: Secondary | ICD-10-CM | POA: Diagnosis present

## 2014-01-09 DIAGNOSIS — O09513 Supervision of elderly primigravida, third trimester: Secondary | ICD-10-CM

## 2014-01-09 DIAGNOSIS — O24429 Gestational diabetes mellitus in childbirth, unspecified control: Secondary | ICD-10-CM | POA: Diagnosis present

## 2014-01-09 DIAGNOSIS — O99820 Streptococcus B carrier state complicating pregnancy: Secondary | ICD-10-CM

## 2014-01-09 DIAGNOSIS — O0993 Supervision of high risk pregnancy, unspecified, third trimester: Secondary | ICD-10-CM

## 2014-01-09 DIAGNOSIS — Z3A39 39 weeks gestation of pregnancy: Secondary | ICD-10-CM | POA: Diagnosis present

## 2014-01-09 DIAGNOSIS — Z3403 Encounter for supervision of normal first pregnancy, third trimester: Secondary | ICD-10-CM | POA: Diagnosis present

## 2014-01-09 DIAGNOSIS — O24319 Unspecified pre-existing diabetes mellitus in pregnancy, unspecified trimester: Secondary | ICD-10-CM

## 2014-01-09 DIAGNOSIS — Z98891 History of uterine scar from previous surgery: Secondary | ICD-10-CM

## 2014-01-09 LAB — CBC
HEMATOCRIT: 36.9 % (ref 36.0–46.0)
Hemoglobin: 12.6 g/dL (ref 12.0–15.0)
MCH: 32.1 pg (ref 26.0–34.0)
MCHC: 34.1 g/dL (ref 30.0–36.0)
MCV: 93.9 fL (ref 78.0–100.0)
PLATELETS: 403 10*3/uL — AB (ref 150–400)
RBC: 3.93 MIL/uL (ref 3.87–5.11)
RDW: 14.1 % (ref 11.5–15.5)
WBC: 10.1 10*3/uL (ref 4.0–10.5)

## 2014-01-09 LAB — RPR

## 2014-01-09 LAB — GLUCOSE, CAPILLARY
GLUCOSE-CAPILLARY: 123 mg/dL — AB (ref 70–99)
GLUCOSE-CAPILLARY: 103 mg/dL — AB (ref 70–99)
Glucose-Capillary: 93 mg/dL (ref 70–99)

## 2014-01-09 MED ORDER — CITRIC ACID-SODIUM CITRATE 334-500 MG/5ML PO SOLN
30.0000 mL | ORAL | Status: DC | PRN
  Administered 2014-01-10: 30 mL via ORAL
  Filled 2014-01-09 (×2): qty 15

## 2014-01-09 MED ORDER — FLEET ENEMA 7-19 GM/118ML RE ENEM: 1.0000 | ENEMA | RECTAL | Status: DC | PRN

## 2014-01-09 MED ORDER — ONDANSETRON HCL 4 MG/2ML IJ SOLN: 4.0000 mg | Freq: Four times a day (QID) | INTRAMUSCULAR | Status: DC | PRN

## 2014-01-09 MED ORDER — LIDOCAINE HCL (PF) 1 % IJ SOLN: 30.0000 mL | INTRAMUSCULAR | Status: DC | PRN

## 2014-01-09 MED ORDER — OXYCODONE-ACETAMINOPHEN 5-325 MG PO TABS: 2.0000 | ORAL_TABLET | ORAL | Status: DC | PRN

## 2014-01-09 MED ORDER — PENICILLIN G POTASSIUM 5000000 UNITS IJ SOLR
2.5000 10*6.[IU] | INTRAVENOUS | Status: DC
  Filled 2014-01-09: qty 2.5

## 2014-01-09 MED ORDER — OXYTOCIN 40 UNITS IN LACTATED RINGERS INFUSION - SIMPLE MED: 62.5000 mL/h | INTRAVENOUS | Status: DC

## 2014-01-09 MED ORDER — PENICILLIN G POTASSIUM 5000000 UNITS IJ SOLR
2.5000 10*6.[IU] | INTRAVENOUS | Status: DC | PRN
  Filled 2014-01-09: qty 2.5

## 2014-01-09 MED ORDER — OXYCODONE-ACETAMINOPHEN 5-325 MG PO TABS: 1.0000 | ORAL_TABLET | ORAL | Status: DC | PRN

## 2014-01-09 MED ORDER — ACETAMINOPHEN 325 MG PO TABS: 650.0000 mg | ORAL_TABLET | ORAL | Status: DC | PRN

## 2014-01-09 MED ORDER — LACTATED RINGERS IV SOLN
INTRAVENOUS | Status: DC
  Administered 2014-01-09 – 2014-01-10 (×4): via INTRAVENOUS

## 2014-01-09 MED ORDER — OXYTOCIN BOLUS FROM INFUSION: 500.0000 mL | INTRAVENOUS | Status: DC

## 2014-01-09 MED ORDER — TERBUTALINE SULFATE 1 MG/ML IJ SOLN
0.2500 mg | Freq: Once | INTRAMUSCULAR | Status: AC | PRN
  Administered 2014-01-09: 0.25 mg via SUBCUTANEOUS
  Filled 2014-01-09: qty 1

## 2014-01-09 MED ORDER — LACTATED RINGERS IV SOLN: 500.0000 mL | INTRAVENOUS | Status: DC | PRN

## 2014-01-09 MED ORDER — MISOPROSTOL 25 MCG QUARTER TABLET
25.0000 ug | ORAL_TABLET | ORAL | Status: DC | PRN
  Administered 2014-01-09: 25 ug via VAGINAL
  Filled 2014-01-09: qty 0.25

## 2014-01-09 MED ORDER — DEXTROSE 5 % IV SOLN
5.0000 10*6.[IU] | Freq: Once | INTRAVENOUS | Status: DC
  Filled 2014-01-09: qty 5

## 2014-01-09 MED ORDER — PENICILLIN G POTASSIUM 5000000 UNITS IJ SOLR
5.0000 10*6.[IU] | Freq: Once | INTRAMUSCULAR | Status: AC | PRN
  Filled 2014-01-09: qty 5

## 2014-01-09 NOTE — Progress Notes (Signed)
Delayed entry  LABOR PROGRESS NOTE  Teresa Cain is a 38 y.o. G1P0000 at [redacted]w[redacted]d  admitted for induction of labor due to BDM.  Subjective: Comfortable, doing squats  Objective: BP 130/83 mmHg  Pulse 98  Temp(Src) 97.8 F (36.6 C) (Oral)  Resp 18  Ht 5\' 1"  (1.549 m)  Wt 159 lb (72.122 kg)  BMI 30.06 kg/m2  SpO2 100%  LMP 04/18/2013 or  Filed Vitals:   01/09/14 1830 01/09/14 1928 01/09/14 1930 01/09/14 2034  BP: 121/78  134/72 130/83  Pulse: 96 85 89 98  Temp: 97.8 F (36.6 C)     TempSrc: Oral     Resp: 20  18 18   Height:      Weight:      SpO2:  100%         FHT: prolonged deceleration down to 60s UC:  Q64min, fundus firm and tachysystolic SVE:   Dilation: 1 Effacement (%): 60 Station: -2 Exam by:: Dr Deniece Ree  Dilation: 1 Effacement (%): 60 Cervical Position: Middle Station: -2 Presentation: Vertex Exam by:: Dr Deniece Ree   Labs: Lab Results  Component Value Date   WBC 10.1 01/09/2014   HGB 12.6 01/09/2014   HCT 36.9 01/09/2014   MCV 93.9 01/09/2014   PLT 403* 01/09/2014    Assessment / Plan: induction due to BDM s/p cytotec  Labor: given terbutaline, O2 and position changes.  deceleration resolved.  subsequently held repeat cytotec dosage secondary to q69min contractions.  pt initially closed, finally able to place FB at 1730 Fetal Wellbeing:  Category II Pain Control:  Labor support without medications Anticipated MOD:  NSVD  Teresa Urizar ROCIO, MD 01/09/2014, 8:40 PM

## 2014-01-09 NOTE — H&P (Signed)
LABOR ADMISSION HISTORY AND PHYSICAL  Teresa Cain is a 38 y.o. female G1P0000 with IUP at [redacted]w[redacted]d by [redacted]w[redacted]d dating sono presenting for for IOL secondary to BDM on glyburide. She reports +FMs, No LOF, no VB, no blurry vision, headaches or peripheral edema, and RUQ pain. She desires an epidural for labor pain control. She plans on breast feeding. She request nexplanon vs mirena for birth control.  Dating: By [redacted]w[redacted]d dating sono --->  Estimated Date of Delivery: 01/16/14    Prenatal History/Complications:  Past Medical History: Past Medical History  Diagnosis Date  . No pertinent past medical history   . Head ache 2001    Spinal tap procedure was done to r/o meningitis    Past Surgical History: Past Surgical History  Procedure Laterality Date  . No past surgeries      Obstetrical History: OB History    Gravida Para Term Preterm AB TAB SAB Ectopic Multiple Living   1 0 0 0 0 0 0 0 0 0      Social History: History   Social History  . Marital Status: Single    Spouse Name: N/A    Number of Children: N/A  . Years of Education: N/A   Social History Main Topics  . Smoking status: Current Every Day Smoker -- 0.25 packs/day    Types: Cigarettes  . Smokeless tobacco: Never Used  . Alcohol Use: No  . Drug Use: No  . Sexual Activity: Yes   Other Topics Concern  . None   Social History Narrative    Family History: Family History  Problem Relation Age of Onset  . Diabetes Mother   . Hypertension Mother   . Diabetes Maternal Grandmother     Allergies: Allergies  Allergen Reactions  . Shellfish Allergy Anaphylaxis    Prescriptions prior to admission  Medication Sig Dispense Refill Last Dose  . acetaminophen (TYLENOL) 500 MG tablet Take 500 mg by mouth every 6 (six) hours as needed.   Past Week at Unknown time  . glyBURIDE (DIABETA) 2.5 MG tablet Take two tablets in the morning and one tablet at bedtime 60 tablet 5 01/08/2014 at Unknown time  . Prenatal  Multivit-Min-Fe-FA (PRENATAL VITAMINS) 0.8 MG tablet Take 1 tablet by mouth daily. 30 tablet 0 Past Week at Unknown time  . ACCU-CHEK FASTCLIX LANCETS MISC 100 each by Does not apply route 4 (four) times daily. New onset GDM 648.03  For testing 4 times daily  #100 with 10 refills Order called to [place] [phone]   Taking  . glucose blood (ACCU-CHEK AVIVA) test strip Use as instructed 100 each 12 Taking     Review of Systems   All systems reviewed and negative except as stated in HPI  Blood pressure 116/73, pulse 92, temperature 97.5 F (36.4 C), temperature source Oral, resp. rate 20, height 5\' 1"  (1.549 m), weight 159 lb (72.122 kg), last menstrual period 04/18/2013. General appearance: alert and cooperative Lungs: clear to auscultation bilaterally Heart: regular rate and rhythm Abdomen: soft, non-tender; bowel sounds normal Extremities: Homans sign is negative, no sign of DVT Presentation: cephalic Fetal monitoringBaseline: 155 bpm, Variability: Good {> 6 bpm), Accelerations: Reactive and Decelerations: Absent Uterine activityNone Dilation: Closed Effacement (%): 80 Station: -2 Exam by:: Teresa Cain   Prenatal labs: ABO, Rh: B/POS/-- (06/17 1419) Antibody: NEG (06/17 1419) Rubella:   RPR: NON REAC (09/09 0934)  HBsAg: NEGATIVE (06/17 1419)  HIV: NONREACTIVE (09/09 0934)  GBS: Positive (  11/02 0000)  1 hr Glucola 100, 3rd trimester, 3h 100/201/152/92 Genetic screening  normal Anatomy US normal  Clinic  [facility]  Dating  8 wk ultrasound  Genetic Screen  NIPS: Low risk  Anatomic [place]  Nml @ 21 wks   GTT Early:    100           Third trimester: 165 3 hr abnormal  TDaP vaccine  Received 10/24/13  Flu vaccine  11/19/13  GBS pos  Contraception  undecided  Baby Food  Breast  Circumcision  Female  Pediatrician  Teresa Cain  Support Person  Teresa Cain (FOB)       Results for orders placed or performed during the hospital encounter of 01/09/14  (from the past 24 hour(s))  CBC   Collection Time: 01/09/14  9:55 AM  Result Value Ref Range   WBC 10.1 4.0 - 10.5 K/uL   RBC 3.93 3.87 - 5.11 MIL/uL   Hemoglobin 12.6 12.0 - 15.0 g/dL   HCT 36.9 36.0 - 46.0 %   MCV 93.9 78.0 - 100.0 fL   MCH 32.1 26.0 - 34.0 pg   MCHC 34.1 30.0 - 36.0 g/dL   RDW 14.1 11.5 - 15.5 %   Platelets 403 (H) 150 - 400 K/uL  Glucose, capillary   Collection Time: 01/09/14 10:44 AM  Result Value Ref Range   Glucose-Capillary 93 70 - 99 mg/dL    Patient Active Problem List   Diagnosis Date Noted  . Pregnancy and non-insulin-dependent diabetes mellitus 01/09/2014  . [redacted] weeks gestation of pregnancy   . GDM (gestational diabetes mellitus)   . IUGR (intrauterine growth restriction) affecting care of mother   . Group B Streptococcus carrier, +RV culture, currently pregnant 12/23/2013  . Gestational diabetes mellitus in pregnancy 11/02/2013  . Elderly primigravida, antepartum 08/08/2013  . AMA (advanced maternal age) multigravida 35+ 08/01/2013  . Supervision of high risk pregnancy, antepartum 08/01/2013  . Uterine fibroids affecting pregnancy 08/01/2013  . Current smoker 08/01/2013    Assessment: Teresa Cain is a 38 y.o. G1P0000 at [redacted]w[redacted]d here for for IOL secondary to BDM  #Labor: cytotec, foley when able #Pain: None currently #FWB: Cat I #ID:  GBS+, start PCN once in active labor #MOF: breast #MOC:nexplanon vs mirena   Teresa Cain 01/09/2014, 11:03 AM

## 2014-01-09 NOTE — Progress Notes (Signed)
Patient ID: Teresa Cain, female   DOB: [birth year], 38 y.o.   MRN: [id number]  Breathing through contractions  Foley in place  No pitocin now.   Contractions every few minutes, painful per pt report  FHR reassuring. RN had wanted to do intermittent monitoring, but since she had a decel before, will do continuous.  Anticipate increased progress

## 2014-01-10 ENCOUNTER — Inpatient Hospital Stay (HOSPITAL_COMMUNITY): Payer: Medicaid Other | Admitting: Anesthesiology

## 2014-01-10 ENCOUNTER — Encounter (HOSPITAL_COMMUNITY): Payer: Self-pay

## 2014-01-10 ENCOUNTER — Encounter (HOSPITAL_COMMUNITY)
Admission: RE | Disposition: A | Discharge status: Home / Self Care | Payer: Self-pay | Source: Ambulatory Visit | Attending: Obstetrics & Gynecology

## 2014-01-10 DIAGNOSIS — Z98891 History of uterine scar from previous surgery: Secondary | ICD-10-CM

## 2014-01-10 DIAGNOSIS — Z3A39 39 weeks gestation of pregnancy: Secondary | ICD-10-CM

## 2014-01-10 DIAGNOSIS — O24429 Gestational diabetes mellitus in childbirth, unspecified control: Secondary | ICD-10-CM

## 2014-01-10 LAB — GLUCOSE, CAPILLARY: GLUCOSE-CAPILLARY: 105 mg/dL — AB (ref 70–99)

## 2014-01-10 SURGERY — Surgical Case
Anesthesia: Epidural | Site: Abdomen

## 2014-01-10 MED ORDER — SODIUM CHLORIDE 0.9 % IJ SOLN: 3.0000 mL | INTRAMUSCULAR | Status: DC | PRN

## 2014-01-10 MED ORDER — MORPHINE SULFATE 0.5 MG/ML IJ SOLN
INTRAMUSCULAR | Status: AC
  Filled 2014-01-10: qty 10

## 2014-01-10 MED ORDER — EPHEDRINE 5 MG/ML INJ: 10.0000 mg | INTRAVENOUS | Status: DC | PRN

## 2014-01-10 MED ORDER — OXYTOCIN 10 UNIT/ML IJ SOLN
40.0000 [IU] | INTRAVENOUS | Status: DC | PRN
  Administered 2014-01-10: 40 [IU] via INTRAVENOUS

## 2014-01-10 MED ORDER — FENTANYL 2.5 MCG/ML BUPIVACAINE 1/10 % EPIDURAL INFUSION (WH - ANES)
14.0000 mL/h | INTRAMUSCULAR | Status: DC | PRN
  Administered 2014-01-10: 14 mL/h via EPIDURAL
  Filled 2014-01-10: qty 125

## 2014-01-10 MED ORDER — IBUPROFEN 600 MG PO TABS
600.0000 mg | ORAL_TABLET | Freq: Four times a day (QID) | ORAL | Status: DC
  Administered 2014-01-11 – 2014-01-12 (×6): 600 mg via ORAL
  Filled 2014-01-10 (×6): qty 1

## 2014-01-10 MED ORDER — FENTANYL 2.5 MCG/ML BUPIVACAINE 1/10 % EPIDURAL INFUSION (WH - ANES)
INTRAMUSCULAR | Status: DC | PRN
  Administered 2014-01-10: 14 mL/h via EPIDURAL

## 2014-01-10 MED ORDER — MEPERIDINE HCL 25 MG/ML IJ SOLN
INTRAMUSCULAR | Status: AC
  Filled 2014-01-10: qty 1

## 2014-01-10 MED ORDER — MAGNESIUM HYDROXIDE 400 MG/5ML PO SUSP: 30.0000 mL | ORAL | Status: DC | PRN

## 2014-01-10 MED ORDER — MORPHINE SULFATE (PF) 0.5 MG/ML IJ SOLN
INTRAMUSCULAR | Status: DC | PRN
  Administered 2014-01-10: 4 mg via EPIDURAL

## 2014-01-10 MED ORDER — PHENYLEPHRINE 40 MCG/ML (10ML) SYRINGE FOR IV PUSH (FOR BLOOD PRESSURE SUPPORT)
80.0000 ug | PREFILLED_SYRINGE | INTRAVENOUS | Status: AC | PRN
  Administered 2014-01-10 (×3): 80 ug via INTRAVENOUS
  Filled 2014-01-10: qty 10

## 2014-01-10 MED ORDER — NALOXONE HCL 0.4 MG/ML IJ SOLN: 0.4000 mg | INTRAMUSCULAR | Status: DC | PRN

## 2014-01-10 MED ORDER — DIPHENHYDRAMINE HCL 50 MG/ML IJ SOLN: 12.5000 mg | INTRAMUSCULAR | Status: DC | PRN

## 2014-01-10 MED ORDER — NALBUPHINE HCL 10 MG/ML IJ SOLN: 5.0000 mg | INTRAMUSCULAR | Status: DC | PRN

## 2014-01-10 MED ORDER — DIBUCAINE 1 % RE OINT
1.0000 | TOPICAL_OINTMENT | RECTAL | Status: DC | PRN
Start: 2014-01-10 — End: 2014-01-12

## 2014-01-10 MED ORDER — DIPHENHYDRAMINE HCL 25 MG PO CAPS
25.0000 mg | ORAL_CAPSULE | ORAL | Status: DC | PRN
  Filled 2014-01-10: qty 1

## 2014-01-10 MED ORDER — LIDOCAINE HCL (PF) 1 % IJ SOLN
INTRAMUSCULAR | Status: DC | PRN
  Administered 2014-01-10 (×2): 4 mL

## 2014-01-10 MED ORDER — LACTATED RINGERS IV SOLN
INTRAVENOUS | Status: DC | PRN
  Administered 2014-01-10 (×3): via INTRAVENOUS

## 2014-01-10 MED ORDER — LANOLIN HYDROUS EX OINT: 1.0000 "application " | TOPICAL_OINTMENT | CUTANEOUS | Status: DC | PRN

## 2014-01-10 MED ORDER — KETOROLAC TROMETHAMINE 30 MG/ML IJ SOLN
INTRAMUSCULAR | Status: AC
  Administered 2014-01-10: 30 mg via INTRAMUSCULAR
  Filled 2014-01-10: qty 1

## 2014-01-10 MED ORDER — LIDOCAINE-EPINEPHRINE (PF) 2 %-1:200000 IJ SOLN
INTRAMUSCULAR | Status: AC
  Filled 2014-01-10: qty 20

## 2014-01-10 MED ORDER — SIMETHICONE 80 MG PO CHEW
80.0000 mg | CHEWABLE_TABLET | ORAL | Status: DC
  Administered 2014-01-11 – 2014-01-12 (×2): 80 mg via ORAL
  Filled 2014-01-10 (×2): qty 1

## 2014-01-10 MED ORDER — OXYCODONE-ACETAMINOPHEN 5-325 MG PO TABS: 1.0000 | ORAL_TABLET | ORAL | Status: DC | PRN

## 2014-01-10 MED ORDER — FENTANYL CITRATE 0.05 MG/ML IJ SOLN
INTRAMUSCULAR | Status: AC
  Administered 2014-01-10: 50 ug via INTRAVENOUS
  Filled 2014-01-10: qty 2

## 2014-01-10 MED ORDER — FENTANYL CITRATE 0.05 MG/ML IJ SOLN: 25.0000 ug | INTRAMUSCULAR | Status: DC | PRN

## 2014-01-10 MED ORDER — LACTATED RINGERS IV SOLN
INTRAVENOUS | Status: DC | PRN
  Administered 2014-01-10: 09:00:00 via INTRAVENOUS

## 2014-01-10 MED ORDER — SENNOSIDES-DOCUSATE SODIUM 8.6-50 MG PO TABS
2.0000 | ORAL_TABLET | ORAL | Status: DC
  Administered 2014-01-11 – 2014-01-12 (×2): 2 via ORAL
  Filled 2014-01-10 (×2): qty 2

## 2014-01-10 MED ORDER — OXYTOCIN 40 UNITS IN LACTATED RINGERS INFUSION - SIMPLE MED: 62.5000 mL/h | INTRAVENOUS | Status: AC

## 2014-01-10 MED ORDER — DIPHENHYDRAMINE HCL 25 MG PO CAPS: 25.0000 mg | ORAL_CAPSULE | Freq: Four times a day (QID) | ORAL | Status: DC | PRN

## 2014-01-10 MED ORDER — LACTATED RINGERS IV SOLN
INTRAVENOUS | Status: DC
  Administered 2014-01-10: 300 mL via INTRAUTERINE

## 2014-01-10 MED ORDER — NALOXONE HCL 1 MG/ML IJ SOLN
1.0000 ug/kg/h | INTRAVENOUS | Status: DC | PRN
  Filled 2014-01-10: qty 2

## 2014-01-10 MED ORDER — MENTHOL 3 MG MT LOZG: 1.0000 | LOZENGE | OROMUCOSAL | Status: DC | PRN

## 2014-01-10 MED ORDER — LACTATED RINGERS IV SOLN
500.0000 mL | Freq: Once | INTRAVENOUS | Status: AC
  Administered 2014-01-10: 500 mL via INTRAVENOUS

## 2014-01-10 MED ORDER — TERBUTALINE SULFATE 1 MG/ML IJ SOLN: 0.2500 mg | Freq: Once | INTRAMUSCULAR | Status: DC | PRN

## 2014-01-10 MED ORDER — ONDANSETRON HCL 4 MG/2ML IJ SOLN: 4.0000 mg | Freq: Three times a day (TID) | INTRAMUSCULAR | Status: DC | PRN

## 2014-01-10 MED ORDER — NALBUPHINE HCL 10 MG/ML IJ SOLN
5.0000 mg | Freq: Once | INTRAMUSCULAR | Status: AC | PRN
Start: 2014-01-10 — End: 2014-01-10

## 2014-01-10 MED ORDER — BUPIVACAINE HCL (PF) 0.5 % IJ SOLN
INTRAMUSCULAR | Status: AC
  Filled 2014-01-10: qty 30

## 2014-01-10 MED ORDER — PRENATAL MULTIVITAMIN CH
1.0000 | ORAL_TABLET | Freq: Every day | ORAL | Status: DC
  Administered 2014-01-11: 1 via ORAL
  Filled 2014-01-10: qty 1

## 2014-01-10 MED ORDER — OXYCODONE-ACETAMINOPHEN 5-325 MG PO TABS: 2.0000 | ORAL_TABLET | ORAL | Status: DC | PRN

## 2014-01-10 MED ORDER — PHENYLEPHRINE 40 MCG/ML (10ML) SYRINGE FOR IV PUSH (FOR BLOOD PRESSURE SUPPORT)
PREFILLED_SYRINGE | INTRAVENOUS | Status: AC
  Filled 2014-01-10: qty 5

## 2014-01-10 MED ORDER — PENICILLIN G POTASSIUM 5000000 UNITS IJ SOLR
5.0000 10*6.[IU] | Freq: Once | INTRAVENOUS | Status: AC
  Administered 2014-01-10: 5 10*6.[IU] via INTRAVENOUS
  Filled 2014-01-10: qty 5

## 2014-01-10 MED ORDER — PENICILLIN G POTASSIUM 5000000 UNITS IJ SOLR
2.5000 10*6.[IU] | INTRAVENOUS | Status: DC
  Administered 2014-01-10: 2.5 10*6.[IU] via INTRAVENOUS
  Filled 2014-01-10 (×3): qty 2.5

## 2014-01-10 MED ORDER — BUPIVACAINE HCL (PF) 0.5 % IJ SOLN
INTRAMUSCULAR | Status: DC | PRN
  Administered 2014-01-10: 30 mL

## 2014-01-10 MED ORDER — MEPERIDINE HCL 25 MG/ML IJ SOLN
INTRAMUSCULAR | Status: DC | PRN
  Administered 2014-01-10: 25 mg via INTRAVENOUS

## 2014-01-10 MED ORDER — IBUPROFEN 600 MG PO TABS: 600.0000 mg | ORAL_TABLET | Freq: Four times a day (QID) | ORAL | Status: DC

## 2014-01-10 MED ORDER — SCOPOLAMINE 1 MG/3DAYS TD PT72: 1.0000 | MEDICATED_PATCH | Freq: Once | TRANSDERMAL | Status: DC

## 2014-01-10 MED ORDER — OXYTOCIN 10 UNIT/ML IJ SOLN
INTRAMUSCULAR | Status: AC
  Filled 2014-01-10: qty 4

## 2014-01-10 MED ORDER — SODIUM BICARBONATE 8.4 % IV SOLN
INTRAVENOUS | Status: AC
  Filled 2014-01-10: qty 50

## 2014-01-10 MED ORDER — ONDANSETRON HCL 4 MG/2ML IJ SOLN
INTRAMUSCULAR | Status: AC
  Filled 2014-01-10: qty 2

## 2014-01-10 MED ORDER — MEPERIDINE HCL 25 MG/ML IJ SOLN: 6.2500 mg | INTRAMUSCULAR | Status: DC | PRN

## 2014-01-10 MED ORDER — TERBUTALINE SULFATE 1 MG/ML IJ SOLN
INTRAMUSCULAR | Status: AC
  Filled 2014-01-10: qty 1

## 2014-01-10 MED ORDER — TETANUS-DIPHTH-ACELL PERTUSSIS 5-2.5-18.5 LF-MCG/0.5 IM SUSP: 0.5000 mL | Freq: Once | INTRAMUSCULAR | Status: DC

## 2014-01-10 MED ORDER — KETOROLAC TROMETHAMINE 30 MG/ML IJ SOLN: 30.0000 mg | Freq: Four times a day (QID) | INTRAMUSCULAR | Status: AC | PRN

## 2014-01-10 MED ORDER — ONDANSETRON HCL 4 MG PO TABS: 4.0000 mg | ORAL_TABLET | ORAL | Status: DC | PRN

## 2014-01-10 MED ORDER — SIMETHICONE 80 MG PO CHEW: 80.0000 mg | CHEWABLE_TABLET | ORAL | Status: DC | PRN

## 2014-01-10 MED ORDER — PHENYLEPHRINE HCL 10 MG/ML IJ SOLN
INTRAMUSCULAR | Status: DC | PRN
  Administered 2014-01-10: 40 ug via INTRAVENOUS
  Administered 2014-01-10 (×2): 80 ug via INTRAVENOUS

## 2014-01-10 MED ORDER — KETOROLAC TROMETHAMINE 30 MG/ML IJ SOLN
30.0000 mg | Freq: Four times a day (QID) | INTRAMUSCULAR | Status: AC | PRN
  Administered 2014-01-10: 30 mg via INTRAMUSCULAR

## 2014-01-10 MED ORDER — MEASLES, MUMPS & RUBELLA VAC ~~LOC~~ INJ
0.5000 mL | INJECTION | Freq: Once | SUBCUTANEOUS | Status: DC
  Filled 2014-01-10: qty 0.5

## 2014-01-10 MED ORDER — ZOLPIDEM TARTRATE 5 MG PO TABS: 5.0000 mg | ORAL_TABLET | Freq: Every evening | ORAL | Status: DC | PRN

## 2014-01-10 MED ORDER — OXYTOCIN 40 UNITS IN LACTATED RINGERS INFUSION - SIMPLE MED
1.0000 m[IU]/min | INTRAVENOUS | Status: DC
  Administered 2014-01-10: 1 m[IU]/min via INTRAVENOUS
  Filled 2014-01-10: qty 1000

## 2014-01-10 MED ORDER — PHENYLEPHRINE 40 MCG/ML (10ML) SYRINGE FOR IV PUSH (FOR BLOOD PRESSURE SUPPORT)
80.0000 ug | PREFILLED_SYRINGE | INTRAVENOUS | Status: DC | PRN
  Administered 2014-01-10: 80 ug via INTRAVENOUS
  Filled 2014-01-10: qty 10

## 2014-01-10 MED ORDER — NALBUPHINE HCL 10 MG/ML IJ SOLN: 5.0000 mg | Freq: Once | INTRAMUSCULAR | Status: AC | PRN

## 2014-01-10 MED ORDER — CEFAZOLIN SODIUM-DEXTROSE 2-3 GM-% IV SOLR
2.0000 g | Freq: Once | INTRAVENOUS | Status: DC
  Filled 2014-01-10: qty 50

## 2014-01-10 MED ORDER — CEFAZOLIN SODIUM-DEXTROSE 2-3 GM-% IV SOLR
INTRAVENOUS | Status: DC | PRN
  Administered 2014-01-10: 2 g via INTRAVENOUS

## 2014-01-10 MED ORDER — WITCH HAZEL-GLYCERIN EX PADS
1.0000 | MEDICATED_PAD | CUTANEOUS | Status: DC | PRN
Start: 2014-01-10 — End: 2014-01-12

## 2014-01-10 MED ORDER — ONDANSETRON HCL 4 MG/2ML IJ SOLN
INTRAMUSCULAR | Status: DC | PRN
  Administered 2014-01-10: 4 mg via INTRAVENOUS

## 2014-01-10 MED ORDER — ONDANSETRON HCL 4 MG/2ML IJ SOLN: 4.0000 mg | INTRAMUSCULAR | Status: DC | PRN

## 2014-01-10 MED ORDER — SODIUM BICARBONATE 8.4 % IV SOLN
INTRAVENOUS | Status: DC | PRN
  Administered 2014-01-10 (×2): 5 mL via EPIDURAL

## 2014-01-10 MED ORDER — FENTANYL CITRATE 0.05 MG/ML IJ SOLN
50.0000 ug | INTRAMUSCULAR | Status: DC | PRN
  Administered 2014-01-10: 50 ug via INTRAVENOUS

## 2014-01-10 SURGICAL SUPPLY — 36 items
CLAMP CORD UMBIL (MISCELLANEOUS) IMPLANT
CLOTH BEACON ORANGE TIMEOUT ST (SAFETY) ×3 IMPLANT
DRAPE SHEET LG 3/4 BI-LAMINATE (DRAPES) IMPLANT
DRSG OPSITE POSTOP 4X10 (GAUZE/BANDAGES/DRESSINGS) ×3 IMPLANT
DURAPREP 26ML APPLICATOR (WOUND CARE) ×3 IMPLANT
ELECT REM PT RETURN 9FT ADLT (ELECTROSURGICAL) ×3
ELECTRODE REM PT RTRN 9FT ADLT (ELECTROSURGICAL) ×1 IMPLANT
EXTRACTOR VACUUM M CUP 4 TUBE (SUCTIONS) IMPLANT
EXTRACTOR VACUUM M CUP 4' TUBE (SUCTIONS)
GLOVE BIOGEL PI IND STRL 7.0 (GLOVE) ×1 IMPLANT
GLOVE BIOGEL PI INDICATOR 7.0 (GLOVE) ×4
GLOVE ECLIPSE 7.0 STRL STRAW (GLOVE) ×3 IMPLANT
GOWN STRL REUS W/TWL LRG LVL3 (GOWN DISPOSABLE) ×8 IMPLANT
HEMOSTAT SURGICEL 4X8 (HEMOSTASIS) ×2 IMPLANT
KIT ABG SYR 3ML LUER SLIP (SYRINGE) ×2 IMPLANT
NDL HYPO 25X5/8 SAFETYGLIDE (NEEDLE) ×1 IMPLANT
NEEDLE HYPO 22GX1.5 SAFETY (NEEDLE) ×3 IMPLANT
NEEDLE HYPO 25X5/8 SAFETYGLIDE (NEEDLE) ×3 IMPLANT
NS IRRIG 1000ML POUR BTL (IV SOLUTION) ×3 IMPLANT
PACK C SECTION WH (CUSTOM PROCEDURE TRAY) ×3 IMPLANT
PAD ABD 7.5X8 STRL (GAUZE/BANDAGES/DRESSINGS) ×3 IMPLANT
PAD OB MATERNITY 4.3X12.25 (PERSONAL CARE ITEMS) ×3 IMPLANT
RTRCTR C-SECT PINK 25CM LRG (MISCELLANEOUS) ×2 IMPLANT
SPONGE GAUZE 4X4 12PLY STER LF (GAUZE/BANDAGES/DRESSINGS) ×4 IMPLANT
STAPLER VISISTAT 35W (STAPLE) IMPLANT
SUT PDS AB 0 CT1 27 (SUTURE) IMPLANT
SUT PDS AB 0 CTX 36 PDP370T (SUTURE) IMPLANT
SUT PLAIN 2 0 XLH (SUTURE) IMPLANT
SUT VIC AB 0 CT1 36 (SUTURE) ×8 IMPLANT
SUT VIC AB 0 CTX 36 (SUTURE) ×6
SUT VIC AB 0 CTX36XBRD ANBCTRL (SUTURE) ×2 IMPLANT
SUT VIC AB 4-0 KS 27 (SUTURE) ×3 IMPLANT
SYR CONTROL 10ML LL (SYRINGE) ×3 IMPLANT
TOWEL OR 17X24 6PK STRL BLUE (TOWEL DISPOSABLE) ×3 IMPLANT
TRAY FOLEY CATH 14FR (SET/KITS/TRAYS/PACK) ×3 IMPLANT
WATER STERILE IRR 1000ML POUR (IV SOLUTION) ×3 IMPLANT

## 2014-01-10 NOTE — Lactation Note (Signed)
This note was copied from the chart of Teresa Cain. Lactation Consultation Note  Initial visit done.  Breastfeeding consultation services and support information left with patient.  Baby is currently nursing well.  Mom is very sleepy and unable to keep her eves open.  Teaching will need to be done later when she is more alert.  Patient Name: Teresa Aveya Beal [id number] Date: 01/10/2014 Reason for consult: Initial assessment   Maternal Data Does the patient have breastfeeding experience prior to this delivery?: No  Feeding Feeding Type: Breast Milk  LATCH Score/Interventions Latch: Grasps breast easily, tongue down, lips flanged, rhythmical sucking.  Audible Swallowing: None  Type of Nipple: Everted at rest and after stimulation  Comfort (Breast/Nipple): Soft / non-tender     Hold (Positioning): No assistance needed to correctly position infant at breast.  LATCH Score: 8  Lactation Tools Discussed/Used     Consult Status Consult Status: Follow-up Date: 01/11/14 Follow-up type: In-patient    Ave Filter 01/10/2014, 5:30 PM

## 2014-01-10 NOTE — Plan of Care (Signed)
Problem: Phase I Progression Outcomes Goal: Assess per MD/Nurse,Routine-VS,FHR,UC,Head to Toe assess Outcome: Completed/Met Date Met:  01/10/14 Goal: Obtain and review prenatal records Outcome: Completed/Met Date Met:  01/10/14 Goal: Pain controlled with appropriate interventions Outcome: Completed/Met Date Met:  01/10/14 Goal: OOB as tolerated unless otherwise ordered Outcome: Completed/Met Date Met:  01/10/14 Goal: Tolerating diet Outcome: Completed/Met Date Met:  01/10/14 Goal: Medications/IV Fluids N/A Outcome: Completed/Met Date Met:  01/10/14 Goal: Induction meds as ordered Outcome: Completed/Met Date Met:  01/10/14 Goal: IV Pain medications as ordered Outcome: Completed/Met Date Met:  01/10/14 Goal: Pitocin as ordered Outcome: Completed/Met Date Met:  01/10/14 Goal: FHR checked 5 minutes after meds (ROM) Rupture of Membranes Outcome: Completed/Met Date Met:  01/10/14 Goal: Assess/evaluate labor progress and adequacy Outcome: Completed/Met Date Met:  01/10/14 Goal: Assess/evaluate cervical exam prn (q2hrs in active phase) Outcome: Completed/Met Date Met:  01/10/14 Goal: Complete instrument count Outcome: Completed/Met Date Met:  01/10/14 Goal: Count of instrument items (Specify in a note) Outcome: Completed/Met Date Met:  01/10/14 Goal: Discharge home if all goals are met Outcome: Not Applicable Date Met:  31/54/00 Goal: Appropriate patient level of comfort Outcome: Completed/Met Date Met:  01/10/14 Goal: Medical plan of care initiated within 2 hrs of admission Outcome: Completed/Met Date Met:  01/10/14 Goal: Other Phase I Outcomes/Goals Outcome: Not Applicable Date Met:  86/76/19  Problem: Phase II Progression Outcomes Goal: Fetal monitoring per orders Outcome: Completed/Met Date Met:  01/10/14 Goal: Empty bladder prn Outcome: Completed/Met Date Met:  01/10/14 Goal: Activity as indicated Outcome: Completed/Met Date Met:  01/10/14 Goal: Clear liquid diet Outcome:  Completed/Met Date Met:  01/10/14

## 2014-01-10 NOTE — Progress Notes (Signed)
Patient ID: Teresa Cain, female   DOB: 08-05-1975, 38 y.o.   MRN: [id number] Just got an epidural  FHR decels to 80s post epidural when BP dropped.  Usual measures employed and BP is now rising  FHR now 140-150  UCs were adequate but Pitocin turned off during decel.  Now UCs spaced out.  Cervix 5/80/-3.  Amnioinfusion infusing.   Will have baby rest 10 more min then restart Pitocin

## 2014-01-10 NOTE — Anesthesia Procedure Notes (Signed)
Epidural Patient location during procedure: OB  Staffing Anesthesiologist: Nolon Nations R Performed by: anesthesiologist   Preanesthetic Checklist Completed: patient identified, pre-op evaluation, timeout performed, IV checked, risks and benefits discussed and monitors and equipment checked  Epidural Patient position: sitting Prep: site prepped and draped and DuraPrep Patient monitoring: heart rate Approach: midline Location: L3-L4 Injection technique: LOR air and LOR saline  Needle:  Needle type: Tuohy  Needle gauge: 17 G Needle length: 9 cm Needle insertion depth: 6 cm Catheter type: closed end flexible Catheter size: 19 Gauge Catheter at skin depth: 12 cm Test dose: negative  Assessment Sensory level: T8 Events: blood not aspirated, injection not painful, no injection resistance, negative IV test and no paresthesia  Additional Notes Reason for block:procedure for pain

## 2014-01-10 NOTE — Plan of Care (Signed)
Problem: Phase I Progression Outcomes Goal: IS, TCDB as ordered Outcome: Completed/Met Date Met:  01/10/14

## 2014-01-10 NOTE — Progress Notes (Signed)
Patient ID: Teresa Cain, female   DOB: 03/17/1975, 38 y.o.   MRN: [id number] FHR reviewed. Severe variables noted with excellent variability and accels.  Somewhat improved with amnioinfusion.  Allow rest and begin slow pitocin infusion. Fetus will tolerate this or not. Presently Category II, but reassuring.

## 2014-01-10 NOTE — Plan of Care (Signed)
Problem: Phase I Progression Outcomes Goal: Foley catheter patent Outcome: Completed/Met Date Met:  01/10/14

## 2014-01-10 NOTE — Progress Notes (Signed)
Faculty Practice OB/GYN Attending Note  Subjective:  Patient is comfortable with epidural.  Discussed restarting pitocin, but patient declines this given multiple episodes of concerning prolonged FHR decelerations.  She desires to proceed with cesarean delivery for fetal intolerance of labor; does not want to "stress baby out" any more.  Objective:  Blood pressure 116/59, pulse 105, temperature 97.7 F (36.5 C), temperature source Oral, resp. rate 18, height 5\' 1"  (1.549 m), weight 159 lb (72.122 kg), last menstrual period 04/18/2013, SpO2 100 %. FHT  Baseline 150 bpm, moderate variability, +accelerations, occasional variable/late decelerations Toco: q1-6 mins; pitocin off Dilation: 5 Effacement (%): 80 Cervical Position: Middle Station: -3 Presentation: Vertex  Assessment & Plan:  38 y.o. G1P0000 at [redacted]w[redacted]d with AMA,  Class A2 GDM now with failed attempt of IOL due to fetal intolerance of labor, recurrent prolonged FHR decelerations.  She desires cesarean delivery.  The risks of cesarean section discussed with the patient included but were not limited to: bleeding which may require transfusion or reoperation; infection which may require antibiotics; injury to bowel, bladder, ureters or other surrounding organs; injury to the fetus; need for additional procedures including hysterectomy in the event of a life-threatening hemorrhage; placental abnormalities wth subsequent pregnancies, incisional problems, thromboembolic phenomenon and other postoperative/anesthesia complications. The patient concurred with the proposed plan, giving informed written consent for the procedure.   Patient has been NPO since last night she will remain NPO for procedure. Anesthesia and OR aware. Preoperative prophylactic antibiotics and SCDs ordered on call to the OR.  To OR when ready.    Verita Schneiders, MD, [place] Attending [place], [facility]

## 2014-01-10 NOTE — Progress Notes (Addendum)
Patient ID: Teresa Cain, female   DOB: 1976/01/18, 38 y.o.   MRN: [id number]  Breathing through contractions Declines analgesia  FHR stable but not tracing well UCs every 2-3 min  Dilation: 5 Effacement (%): 50 Cervical Position: Middle Station: -2 Presentation: Vertex Exam by:: e. poore, rn  AROM clear fluid ISE placed.  CBGs stable Results for AVANI, SENSABAUGH (MRN [id number]) as of 01/10/2014 00:03  Ref. Range 01/09/2014 10:44 01/09/2014 18:35 01/09/2014 22:31  Glucose-Capillary Latest Range: 70-99 mg/dL 93 123 (H) 103 (H)

## 2014-01-10 NOTE — Anesthesia Preprocedure Evaluation (Addendum)
Anesthesia Evaluation  Patient identified by MRN, date of birth, ID band Patient awake    Reviewed: Allergy & Precautions, H&P , NPO status , Patient's Chart, lab work & pertinent test results  Airway Mallampati: II  TM Distance: >3 FB Neck ROM: Full    Dental no notable dental hx.    Pulmonary neg pulmonary ROS, Current Smoker,  breath sounds clear to auscultation  Pulmonary exam normal       Cardiovascular negative cardio ROS  Rhythm:Regular Rate:Normal     Neuro/Psych  Headaches, negative psych ROS   GI/Hepatic negative GI ROS, Neg liver ROS,   Endo/Other  diabetes, Gestational  Renal/GU negative Renal ROS     Musculoskeletal negative musculoskeletal ROS (+)   Abdominal   Peds  Hematology negative hematology ROS (+)   Anesthesia Other Findings   Reproductive/Obstetrics (+) Pregnancy                            Anesthesia Physical Anesthesia Plan  ASA: II and emergent  Anesthesia Plan: Epidural   Post-op Pain Management:    Induction:   Airway Management Planned: Natural Airway  Additional Equipment:   Intra-op Plan:   Post-operative Plan:   Informed Consent: I have reviewed the patients History and Physical, chart, labs and discussed the procedure including the risks, benefits and alternatives for the proposed anesthesia with the patient or authorized representative who has indicated his/her understanding and acceptance.     Plan Discussed with: CRNA, Anesthesiologist and Surgeon  Anesthesia Plan Comments: (Fetal intolerance to labor. Will use epidural for C/Section. M.Royce Macadamia, MD)       Anesthesia Quick Evaluation

## 2014-01-10 NOTE — Progress Notes (Signed)
Patient ID: Teresa Cain, female   DOB: [birth year], 38 y.o.   MRN: [id number] Moaning with pain Still declines analgesia  Redeveloped deep variable decels UCs every 2-4 min, irregular  IUPC inserted  Will start amnioinfusion  Consulted Dr Kennon Rounds. Will continue amnioinfusion and try to start Pitocin 1x1 if possible

## 2014-01-10 NOTE — Transfer of Care (Signed)
Immediate Anesthesia Transfer of Care Note  Patient: Teresa Cain  Procedure(s) Performed: Procedure(s): CESAREAN SECTION (N/A)  Patient Location: PACU  Anesthesia Type:Epidural  Level of Consciousness: awake, alert  and oriented  Airway & Oxygen Therapy: Patient Spontanous Breathing  Post-op Assessment: Report given to PACU RN and Post -op Vital signs reviewed and stable  Post vital signs: Reviewed and stable  Complications: No apparent anesthesia complications

## 2014-01-10 NOTE — Addendum Note (Signed)
Addendum  created 01/10/14 1527 by Genevie Ann, CRNA   Modules edited: Notes Section   Notes Section:  File: [id number]

## 2014-01-10 NOTE — Plan of Care (Signed)
Problem: Phase I Progression Outcomes Goal: Initial discharge plan identified Outcome: Completed/Met Date Met:  01/10/14     

## 2014-01-10 NOTE — Anesthesia Postprocedure Evaluation (Signed)
  Anesthesia Post-op Note  Patient: Teresa Cain  Procedure(s) Performed: Procedure(s): CESAREAN SECTION (N/A)  Patient Location: PACU and Mother/Baby  Anesthesia Type:Epidural  Level of Consciousness: awake, alert  and oriented  Airway and Oxygen Therapy: Patient Spontanous Breathing  Post-op Pain: none  Post-op Assessment: Post-op Vital signs reviewed  Post-op Vital Signs: Reviewed and stable  Last Vitals:  Filed Vitals:   01/10/14 1300  BP: 106/61  Pulse: 96  Temp: 36.6 C  Resp: 18    Complications: No apparent anesthesia complications

## 2014-01-10 NOTE — Op Note (Signed)
Teresa Cain PROCEDURE DATE: 01/10/2014  PREOPERATIVE DIAGNOSES: Intrauterine pregnancy at [redacted]w[redacted]d weeks gestation; recurring prolonged FHR decelerations; fetal intolerance of labor; A2 GDM; AMA  POSTOPERATIVE DIAGNOSES: The same  PROCEDURE: Primary Low Transverse Cesarean Section  SURGEON:  Dr. Verita Schneiders  ANESTHESIOLOGIST: Dr. Josephine Igo  INDICATIONS: Teresa Cain is a 38 y.o. G1P0000 at [redacted]w[redacted]d here for cesarean section secondary to the indications listed under preoperative diagnoses; please see preoperative note for further details.  The risks of cesarean section were discussed with the patient including but were not limited to: bleeding which may require transfusion or reoperation; infection which may require antibiotics; injury to bowel, bladder, ureters or other surrounding organs; injury to the fetus; need for additional procedures including hysterectomy in the event of a life-threatening hemorrhage; placental abnormalities wth subsequent pregnancies, incisional problems, thromboembolic phenomenon and other postoperative/anesthesia complications.   The patient concurred with the proposed plan, giving informed written consent for the procedure.    FINDINGS:  Viable female infant in cephalic presentation.  Apgars 8 and 9.  Clear amniotic fluid.  Intact placenta, three vessel cord.  Thick adhesion noted on anterior part of uterus connecting fundal region to lower uterine segment, this was lysed to perform hysterotomy.  Otherwise normal uterus, fallopian tubes and ovaries bilaterally.  ANESTHESIA: Epidural INTRAVENOUS FLUIDS: 3400 ml ESTIMATED BLOOD LOSS: 700 ml URINE OUTPUT:  100 ml SPECIMENS: Placenta sent to pathology COMPLICATIONS: None immediate  PROCEDURE IN DETAIL:  The patient preoperatively received intravenous antibiotics and had sequential compression devices applied to her lower extremities.  She was then taken to the operating room where the epidural anesthesia  was dosed up to surgical level and was found to be adequate. She was then placed in a dorsal supine position with a leftward tilt, and prepped and draped in a sterile manner.  A foley catheter was placed into her bladder and attached to constant gravity.  After an adequate timeout was performed, a Pfannenstiel skin incision was made with scalpel and carried through to the underlying layer of fascia. The fascia was incised in the midline, and this incision was extended bilaterally using the Mayo scissors.  Kocher clamps were applied to the superior aspect of the fascial incision and the underlying rectus muscles were dissected off bluntly. A similar process was carried out on the inferior aspect of the fascial incision. The rectus muscles were separated in the midline bluntly and the peritoneum was entered bluntly. Attention was turned to the lower uterine segment where a thick adhesion was encountered as mentioned above, and was lysed using electrocautery.  A low transverse hysterotomy was made with a scalpel and extended bilaterally bluntly.  The infant was successfully delivered, the cord was clamped and cut and the infant was handed over to awaiting neonatology team. Uterine massage was then administered, and the placenta delivered intact with a three-vessel cord. The uterus was then cleared of clot and debris.  The hysterotomy was closed with 0 Vicryl in a running locked fashion, and an imbricating layer was also placed with 0 Vicryl.  The lysed adhesion has to be suture ligated for hemostasis; Surgicel was also placed on the anterior aspect of the uterus.  The pelvis was cleared of all clot and debris. Hemostasis was confirmed on all surfaces.  The peritoneum and the muscles were reapproximated using 0 Vicryl interrupted stitches. The fascia was then closed using 0 Vicryl in a running fashion.  The subcutaneous layer was irrigated and 30 ml of 0.5% Marcaine was  injected subcutaneously around the incision.  The  skin was closed with a 4-0 Vicryl subcuticular stitch. The patient tolerated the procedure well. Sponge, lap, instrument and needle counts were correct x 2.  She was taken to the recovery room in stable condition.    Verita Schneiders, MD, [place] Attending [place], [facility]

## 2014-01-10 NOTE — Plan of Care (Signed)
Problem: Phase I Progression Outcomes Goal: VS, stable, temp < 100.4 degrees F Outcome: Completed/Met Date Met:  01/10/14

## 2014-01-10 NOTE — OR Nursing (Signed)
Initial assessment done by Olivia Mackie or nurse. Darin Engels rn arrives at 10:52 to take over care of patient. Darin Engels rn

## 2014-01-10 NOTE — Anesthesia Postprocedure Evaluation (Signed)
  Anesthesia Post-op Note  Patient: Teresa Cain  Procedure(s) Performed: Procedure(s): CESAREAN SECTION (N/A)  Patient Location: PACU  Anesthesia Type:Epidural  Level of Consciousness: awake, alert  and oriented  Airway and Oxygen Therapy: Patient Spontanous Breathing  Post-op Pain: none  Post-op Assessment: Post-op Vital signs reviewed, Patient's Cardiovascular Status Stable, Respiratory Function Stable, Patent Airway, No signs of Nausea or vomiting, Pain level controlled, No headache, No backache, No residual numbness and No residual motor weakness  Post-op Vital Signs: Reviewed and stable  Last Vitals:  Filed Vitals:   01/10/14 0913  BP: 87/49  Pulse: 127  Temp:   Resp:     Complications: No apparent anesthesia complications

## 2014-01-11 ENCOUNTER — Encounter (HOSPITAL_COMMUNITY): Payer: Self-pay | Admitting: Obstetrics & Gynecology

## 2014-01-11 LAB — CBC
HCT: 28.6 % — ABNORMAL LOW (ref 36.0–46.0)
Hemoglobin: 9.6 g/dL — ABNORMAL LOW (ref 12.0–15.0)
MCH: 31.6 pg (ref 26.0–34.0)
MCHC: 33.6 g/dL (ref 30.0–36.0)
MCV: 94.1 fL (ref 78.0–100.0)
PLATELETS: 341 10*3/uL (ref 150–400)
RBC: 3.04 MIL/uL — ABNORMAL LOW (ref 3.87–5.11)
RDW: 13.7 % (ref 11.5–15.5)
WBC: 15.9 10*3/uL — ABNORMAL HIGH (ref 4.0–10.5)

## 2014-01-11 MED ORDER — TRAMADOL HCL 50 MG PO TABS: 50.0000 mg | ORAL_TABLET | Freq: Four times a day (QID) | ORAL | Status: DC | PRN

## 2014-01-11 NOTE — Progress Notes (Signed)
Patient has a foley cath in place that was not started with surgery.

## 2014-01-11 NOTE — Progress Notes (Signed)
Foley cath. D/c @ [phone]

## 2014-01-11 NOTE — Plan of Care (Signed)
Problem: Consults Goal: Skin Care Protocol Initiated - if Braden Score 18 or less If consults are not indicated, leave blank or document N/A  Outcome: Not Applicable Date Met:  92/34/14 Goal: Nutrition Consult-if indicated Outcome: Not Applicable Date Met:  43/60/16 Goal: Diabetes Guidelines if Diabetic/Glucose > 140 If diabetic or lab glucose is > 140 mg/dl - Initiate Diabetes/Hyperglycemia Guidelines & Document Interventions  Outcome: Not Applicable Date Met:  58/00/63  Problem: Phase I Progression Outcomes Goal: OOB as tolerated unless otherwise ordered Outcome: Completed/Met Date Met:  01/11/14 Goal: Other Phase I Outcomes/Goals Outcome: Not Applicable Date Met:  49/49/44  Problem: Phase II Progression Outcomes Goal: Rh isoimmunization per orders Outcome: Not Applicable Date Met:  73/95/84 Goal: Tolerating diet Outcome: Completed/Met Date Met:  01/11/14

## 2014-01-11 NOTE — Plan of Care (Signed)
Problem: Phase I Progression Outcomes Goal: Pain controlled with appropriate interventions Outcome: Completed/Met Date Met:  01/11/14 Goal: Voiding adequately Outcome: Completed/Met Date Met:  01/11/14  Problem: Phase II Progression Outcomes Goal: Pain controlled on oral analgesia Outcome: Completed/Met Date Met:  01/11/14 Goal: Progress activity as tolerated unless otherwise ordered Outcome: Completed/Met Date Met:  01/11/14 Goal: Afebrile, VS remain stable Outcome: Completed/Met Date Met:  01/11/14 Goal: Incision intact & without signs/symptoms of infection Outcome: Completed/Met Date Met:  01/11/14 Goal: Other Phase II Outcomes/Goals Outcome: Completed/Met Date Met:  01/11/14

## 2014-01-11 NOTE — Progress Notes (Signed)
UR completed 

## 2014-01-11 NOTE — Progress Notes (Signed)
Subjective: Postpartum Day 1: Cesarean Delivery Patient reports tolerating PO, + flatus and no problems voiding.    Objective: Vital signs in last 24 hours: Temp:  [97.3 F (36.3 C)-98.7 F (37.1 C)] 98.7 F (37.1 C) (11/27 0432) Pulse Rate:  [83-129] 87 (11/27 0432) Resp:  [15-18] 15 (11/27 0432) BP: (87-134)/(40-91) 117/69 mmHg (11/27 0432) SpO2:  [96 %-100 %] 100 % (11/27 0030)  Physical Exam:  General: alert, cooperative, no distress and standing at bedside Lochia: appropriate Uterine Fundus: firm Incision: healing well, no significant drainage DVT Evaluation: No evidence of DVT seen on physical exam.   Recent Labs  01/09/14 0955 01/11/14 0550  HGB 12.6 9.6*  HCT 36.9 28.6*    Assessment/Plan: Status post Cesarean section. Doing well postoperatively.  Continue current care.  [name] 01/11/2014, 7:38 AM  I have seen and examined this patient and agree the above assessment. CRESENZO-DISHMAN,Cabe Lashley 01/16/2014 9:55 AM

## 2014-01-12 MED ORDER — OXYCODONE-ACETAMINOPHEN 5-325 MG PO TABS: 1.0000 | ORAL_TABLET | ORAL | Status: DC | PRN

## 2014-01-12 MED ORDER — IBUPROFEN 600 MG PO TABS: 600.0000 mg | ORAL_TABLET | ORAL | Status: DC | PRN

## 2014-01-12 NOTE — Plan of Care (Signed)
Problem: Consults Goal: Postpartum Patient Education (See Patient Education module for education specifics.)  Outcome: Completed/Met Date Met:  01/12/14  Problem: Discharge Progression Outcomes Goal: Barriers To Progression Addressed/Resolved Outcome: Completed/Met Date Met:  01/12/14 Goal: Activity appropriate for discharge plan Outcome: Completed/Met Date Met:  01/12/14 Goal: Tolerating diet Outcome: Completed/Met Date Met:  17/79/39 Goal: Complications resolved/controlled Outcome: Completed/Met Date Met:  01/12/14 Goal: Pain controlled with appropriate interventions Outcome: Completed/Met Date Met:  01/12/14 Goal: Afebrile, VS remain stable at discharge Outcome: Completed/Met Date Met:  01/12/14 Goal: Remove staples per MD order Outcome: Not Applicable Date Met:  03/00/92 Goal: MMR given as ordered Outcome: Not Applicable Date Met:  33/00/76 Goal: Discharge plan in place and appropriate Outcome: Completed/Met Date Met:  01/12/14 Goal: Other Discharge Outcomes/Goals Outcome: Not Applicable Date Met:  22/63/33

## 2014-01-12 NOTE — Discharge Summary (Signed)
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 G1P1001 who presented for IOL 2/2 BDM on glyburide.  After nearly 24hours of labor, patient with failed IOL due to fetal intolerance of labor, recurrent prolonged FHR decelerations.  Patient taken for LTCS.  On POD#2, patient is doing well without complaints or complications and ready for discharge.  Breastfeeding, desires Mirena for contraception.  Physical Exam:  General: alert, cooperative and no distress Lochia: appropriate Uterine Fundus: firm Incision: healing well, no significant drainage DVT Evaluation: No evidence of DVT seen on physical exam.  Discharge Diagnoses: Term Pregnancy-delivered and Failed induction  Discharge Information: Date: 01/12/2014 Activity: pelvic rest Diet: routine Medications: PNV and Percocet Condition: stable Instructions: refer to practice specific booklet Discharge to: home Follow-up Information    Follow up with [facility]. Schedule an appointment as soon as possible for a visit in 6 weeks.   Why:  For post-partum visit      Follow up with [facility].   Why:  As needed for emergencies   Contact information:   [address] [id number] [place] [phone]      Newborn Data: Live born female  Birth Weight: 6 lb 2.6 oz (2795 g) APGAR: 8, 9  Home with mother.  Teresa Cain 01/12/2014, 7:54 AM

## 2014-01-12 NOTE — Discharge Instructions (Signed)

## 2014-01-13 LAB — TYPE AND SCREEN
ABO/RH(D): B POS
Antibody Screen: POSITIVE
DAT, IgG: NEGATIVE
UNIT DIVISION: 0
UNIT DIVISION: 0

## 2014-01-15 ENCOUNTER — Encounter: Payer: Self-pay | Admitting: Obstetrics & Gynecology

## 2014-02-20 ENCOUNTER — Encounter: Payer: Self-pay | Admitting: Obstetrics & Gynecology

## 2014-02-20 ENCOUNTER — Ambulatory Visit (INDEPENDENT_AMBULATORY_CARE_PROVIDER_SITE_OTHER): Payer: Medicaid Other | Admitting: Obstetrics & Gynecology

## 2014-02-20 VITALS — BP 107/59 | HR 81 | Temp 97.7°F | Resp 20 | Ht 61.0 in | Wt 137.9 lb

## 2014-02-20 DIAGNOSIS — Z01812 Encounter for preprocedural laboratory examination: Secondary | ICD-10-CM

## 2014-02-20 DIAGNOSIS — Z30430 Encounter for insertion of intrauterine contraceptive device: Secondary | ICD-10-CM

## 2014-02-20 LAB — POCT PREGNANCY, URINE: Preg Test, Ur: NEGATIVE

## 2014-02-20 MED ORDER — LEVONORGESTREL 20 MCG/24HR IU IUD
INTRAUTERINE_SYSTEM | Freq: Once | INTRAUTERINE | Status: AC
  Administered 2014-02-20: 15:00:00 via INTRAUTERINE

## 2014-02-20 NOTE — Progress Notes (Signed)
UPT negative today 

## 2014-02-20 NOTE — Progress Notes (Signed)
Subjective: wants Mirena     Teresa Cain is a 39 y.o. female who presents for a postpartum visit. She is 6 weeks postpartum following a low cervical transverse Cesarean section. I have fully reviewed the prenatal and intrapartum course. The delivery was at 58 gestational weeks. Outcome: primary cesarean section, low transverse incision. Anesthesia: epidural. Postpartum course has been good. Baby's course has been normal. Baby is feeding by breast. Bleeding no bleeding. Bowel function is normal. Bladder function is normal. Patient is not sexually active. Contraception method is IUD. Postpartum depression screening: negative.  The following portions of the patient's history were reviewed and updated as appropriate: allergies, current medications, past family history, past medical history, past social history, past surgical history and problem list.  Review of Systems Pertinent items are noted in HPI.   Objective:    BP 107/59 mmHg  Pulse 81  Temp(Src) 97.7 F (36.5 C) (Oral)  Resp 20  Ht 5\' 1"  (1.549 m)  Wt 137 lb 14.4 oz (62.551 kg)  BMI 26.07 kg/m2  Breastfeeding? Yes  General:  alert, cooperative and no distress   Breasts:     Lungs:   Heart:    Abdomen: soft, non-tender; bowel sounds normal; no masses,  no organomegaly and incison looks good   Vulva:  normal  Vagina: normal vagina  Cervix:  no lesions  Corpus: normal  Adnexa:  not evaluated  Rectal Exam: Not performed.       Patient identified, informed consent performed, signed copy in chart, time out was performed.  Urine pregnancy test negative.  Speculum placed in the vagina.  Cervix visualized.  Cleaned with Betadine x 2.  Grasped anteriourly with a single tooth tenaculum.  Uterus sounded to 9 cm.  Mirena IUD placed per manufacturer's recommendations.  Strings trimmed to 3 cm.   Patient given post procedure instructions and Mirena care card with expiration date.  Patient is asked to check IUD strings periodically and  follow up in 4-6 weeks for IUD check. Assessment:     normal postpartum exam. Pap smear not done at today's visit.   Plan:    1. Contraception: IUD Mirena today 2. GDM needs 2 hr GTT 3. Follow up in: 4 weeks or as needed.    Woodroe Mode, MD 02/20/2014

## 2014-02-20 NOTE — Patient Instructions (Signed)
Levonorgestrel intrauterine device (IUD) What is this medicine? LEVONORGESTREL IUD (LEE voe nor jes trel) is a contraceptive (birth control) device. The device is placed inside the uterus by a healthcare professional. It is used to prevent pregnancy and can also be used to treat heavy bleeding that occurs during your period. Depending on the device, it can be used for 3 to 5 years. This medicine may be used for other purposes; ask your health care provider or pharmacist if you have questions. COMMON BRAND NAME(S): LILETTA, Mirena, Skyla What should I tell my health care provider before I take this medicine? They need to know if you have any of these conditions: -abnormal Pap smear -cancer of the breast, uterus, or cervix -diabetes -endometritis -genital or pelvic infection now or in the past -have more than one sexual partner or your partner has more than one partner -heart disease -history of an ectopic or tubal pregnancy -immune system problems -IUD in place -liver disease or tumor -problems with blood clots or take blood-thinners -use intravenous drugs -uterus of unusual shape -vaginal bleeding that has not been explained -an unusual or allergic reaction to levonorgestrel, other hormones, silicone, or polyethylene, medicines, foods, dyes, or preservatives -pregnant or trying to get pregnant -breast-feeding How should I use this medicine? This device is placed inside the uterus by a health care professional. Talk to your pediatrician regarding the use of this medicine in children. Special care may be needed. Overdosage: If you think you have taken too much of this medicine contact a poison control center or emergency room at once. NOTE: This medicine is only for you. Do not share this medicine with others. What if I miss a dose? This does not apply. What may interact with this medicine? Do not take this medicine with any of the following  medications: -amprenavir -bosentan -fosamprenavir This medicine may also interact with the following medications: -aprepitant -barbiturate medicines for inducing sleep or treating seizures -bexarotene -griseofulvin -medicines to treat seizures like carbamazepine, ethotoin, felbamate, oxcarbazepine, phenytoin, topiramate -modafinil -pioglitazone -rifabutin -rifampin -rifapentine -some medicines to treat HIV infection like atazanavir, indinavir, lopinavir, nelfinavir, tipranavir, ritonavir -St. John's wort -warfarin This list may not describe all possible interactions. Give your health care provider a list of all the medicines, herbs, non-prescription drugs, or dietary supplements you use. Also tell them if you smoke, drink alcohol, or use illegal drugs. Some items may interact with your medicine. What should I watch for while using this medicine? Visit your doctor or health care professional for regular check ups. See your doctor if you or your partner has sexual contact with others, becomes HIV positive, or gets a sexual transmitted disease. This product does not protect you against HIV infection (AIDS) or other sexually transmitted diseases. You can check the placement of the IUD yourself by reaching up to the top of your vagina with clean fingers to feel the threads. Do not pull on the threads. It is a good habit to check placement after each menstrual period. Call your doctor right away if you feel more of the IUD than just the threads or if you cannot feel the threads at all. The IUD may come out by itself. You may become pregnant if the device comes out. If you notice that the IUD has come out use a backup birth control method like condoms and call your health care provider. Using tampons will not change the position of the IUD and are okay to use during your period. What side effects may   I notice from receiving this medicine? Side effects that you should report to your doctor or  health care professional as soon as possible: -allergic reactions like skin rash, itching or hives, swelling of the face, lips, or tongue -fever, flu-like symptoms -genital sores -high blood pressure -no menstrual period for 6 weeks during use -pain, swelling, warmth in the leg -pelvic pain or tenderness -severe or sudden headache -signs of pregnancy -stomach cramping -sudden shortness of breath -trouble with balance, talking, or walking -unusual vaginal bleeding, discharge -yellowing of the eyes or skin Side effects that usually do not require medical attention (report to your doctor or health care professional if they continue or are bothersome): -acne -breast pain -change in sex drive or performance -changes in weight -cramping, dizziness, or faintness while the device is being inserted -headache -irregular menstrual bleeding within first 3 to 6 months of use -nausea This list may not describe all possible side effects. Call your doctor for medical advice about side effects. You may report side effects to FDA at 1-800-FDA-1088. Where should I keep my medicine? This does not apply. NOTE: This sheet is a summary. It may not cover all possible information. If you have questions about this medicine, talk to your doctor, pharmacist, or health care provider.  2015, [employer]vier/Gold Standard. (2011-03-04 13:54:04)  

## 2014-02-22 ENCOUNTER — Encounter: Payer: Self-pay | Admitting: *Deleted

## 2014-04-18 ENCOUNTER — Encounter: Payer: Self-pay | Admitting: Obstetrics & Gynecology

## 2014-06-17 IMAGING — US US OB COMP LESS 14 WK
1 series · 13 of 28 positions shown · non-contrast
Comparison: None.

CLINICAL DATA: Positive pregnancy test, abdominal pain.

EXAM:
OBSTETRIC <14 WK US AND TRANSVAGINAL OB US
TECHNIQUE: Both transabdominal and transvaginal ultrasound examinations were
performed for complete evaluation of the gestation as well as the
maternal uterus, adnexal regions, and pelvic cul-de-sac.
Transvaginal technique was performed to assess early pregnancy.

[Series 1: us ob comp less 14 wk · 0.22mm/px · 13 of 64 slices shown]
[im 3/64]
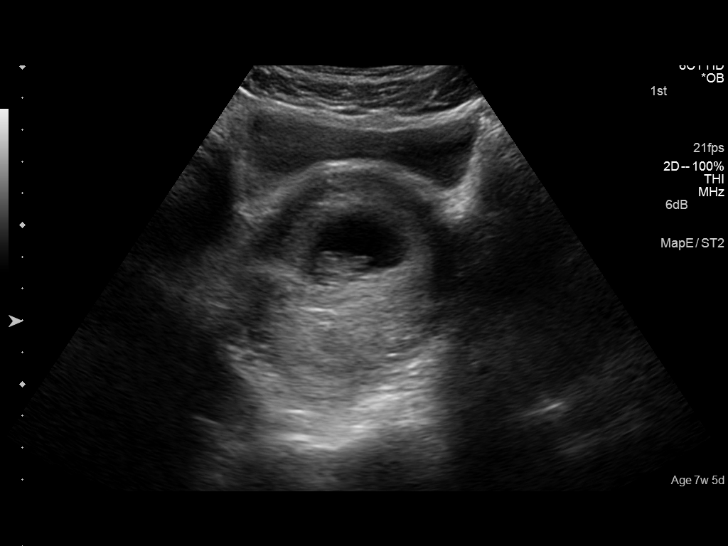
[im 8/64]
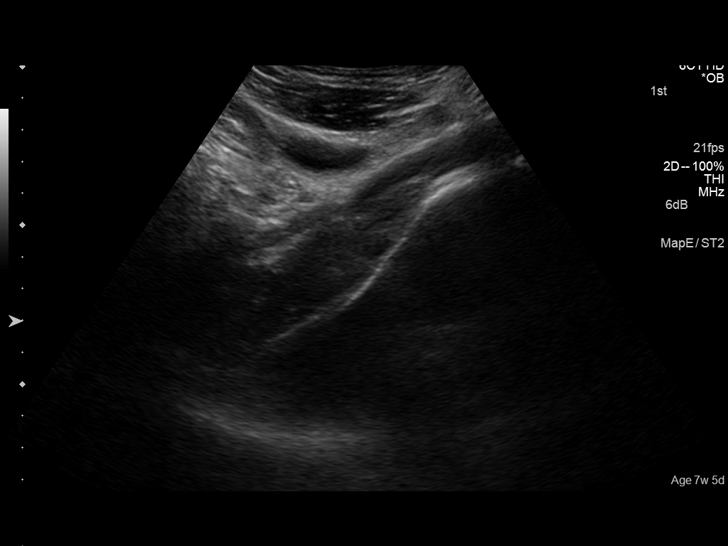
[im 12/64]
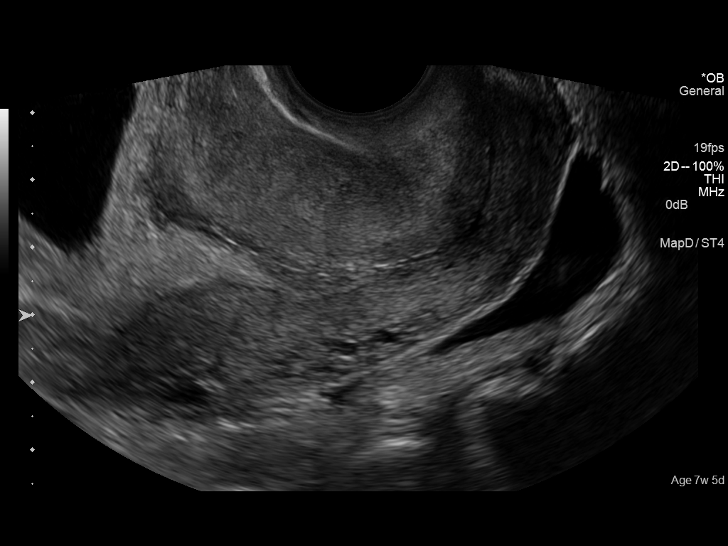
[im 17/64]
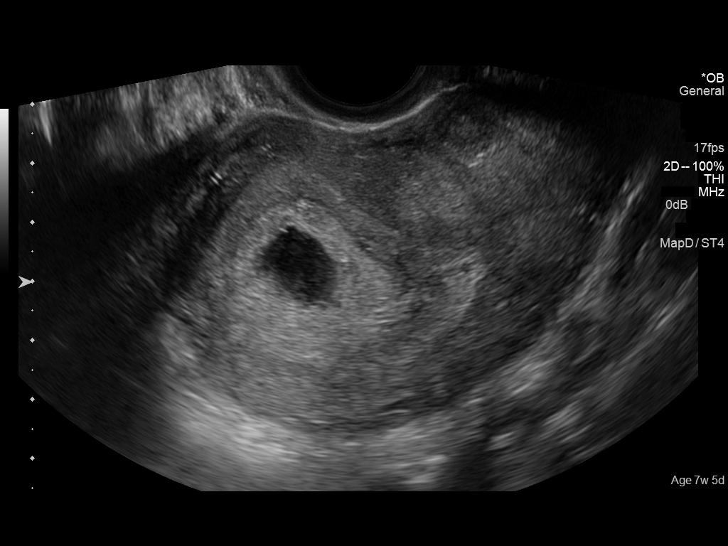
[im 22/64]
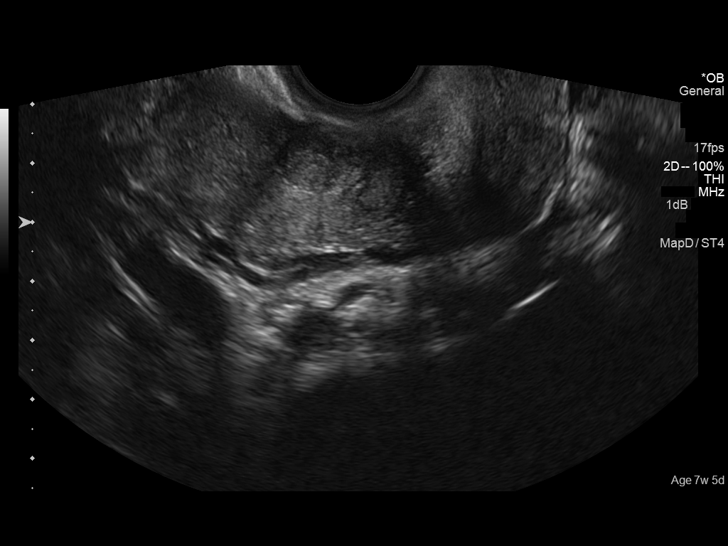
[im 26/64]
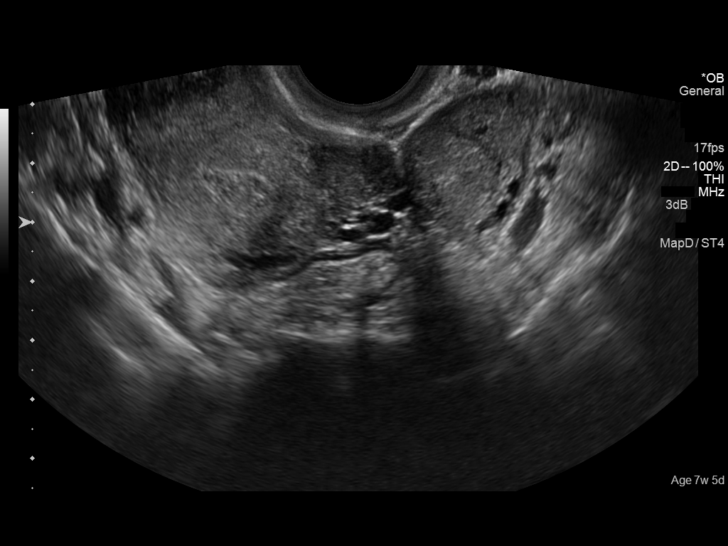
[im 33/64]
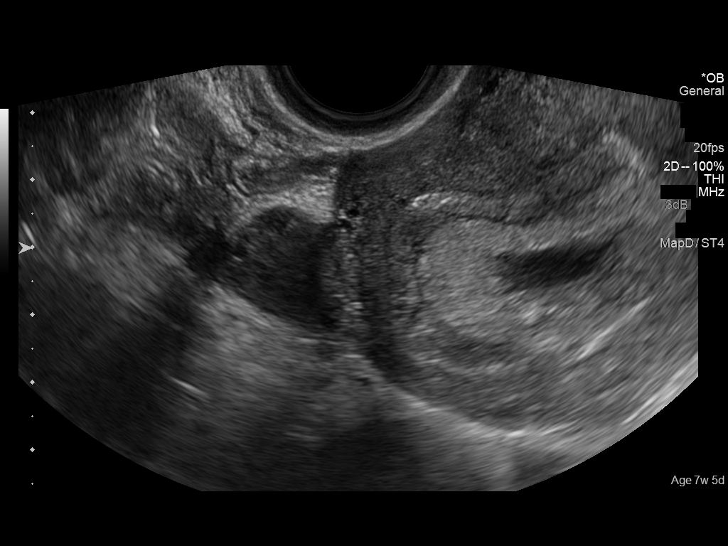
[im 38/64]
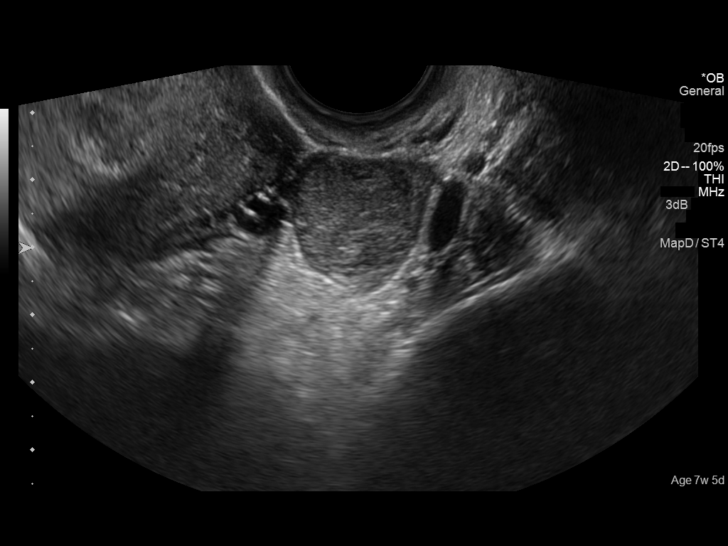
[im 43/64]
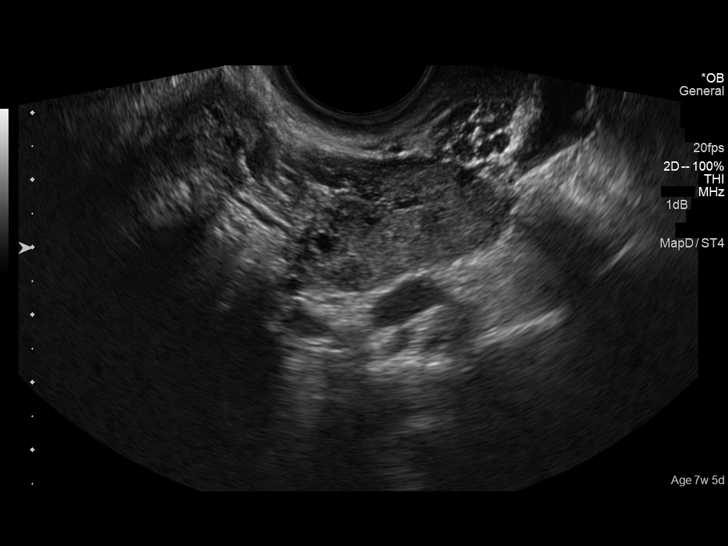
[im 47/64]
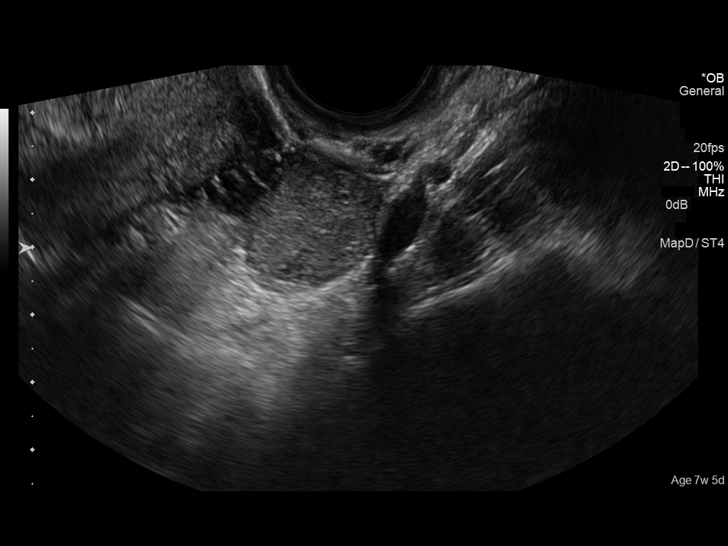
[im 52/64]
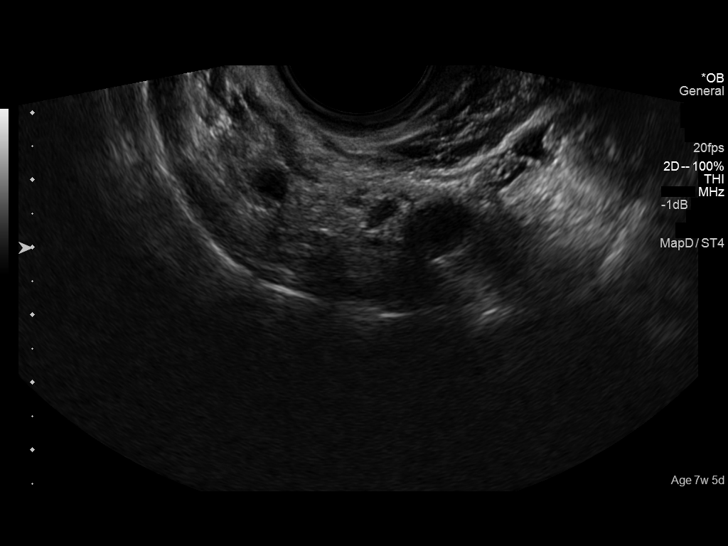
[im 57/64]
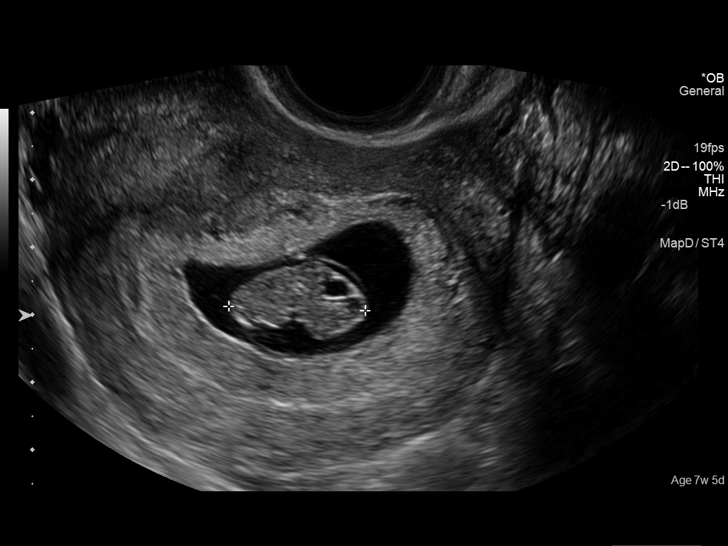
[im 61/64]
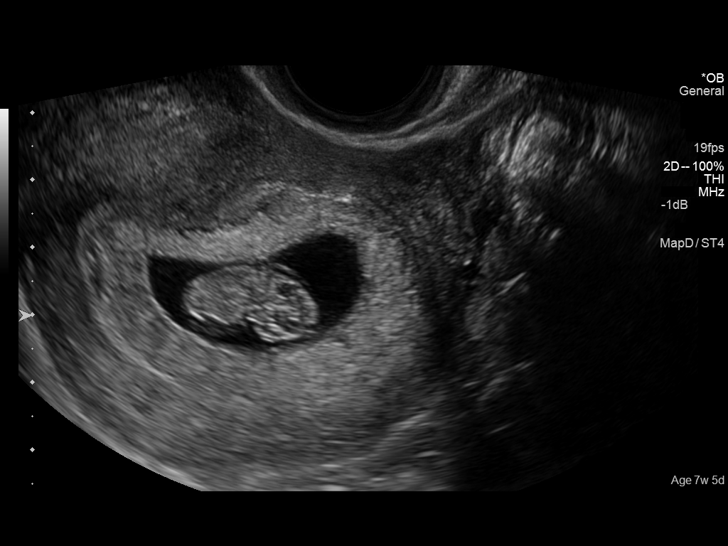

[13 of 28 positions shown; findings below may reference images not displayed]

FINDINGS: Intrauterine gestational sac: Visualized/normal in shape.

Yolk sac:  Present

Embryo:  Present

Cardiac Activity: Present

Heart Rate:  175 bpm

CRL:   20.6  mm   8 w 5 d                  US EDC: January 16, 2014

Maternal uterus/adnexae: The right ovary is normal in appearance.
The left ovary contains a hyperechoic focus measuring 2.2 x 1.5 x
1.8 cm. Two uterine fibroids are demonstrated measuring up to 1.6 cm
in diameter.
IMPRESSION: 1. There is an early IUP with estimated gestational age of 8 weeks 5
days corresponding to an estimated date of confinement January 16, 2014. No subchorionic hemorrhage is demonstrated.
2. A hyperechoic focus measuring 2.2 cm in greatest dimension in the
left ovary may reflect a hemorrhagic corpus luteum cyst. The right
ovary is normal in appearance. Two uterine fibroids are
demonstrated.

## 2014-08-12 ENCOUNTER — Other Ambulatory Visit: Payer: Self-pay

## 2014-09-13 IMAGING — US US OB DETAIL+14 WK
1 series · 12 of 28 positions shown · non-contrast
Comparison: none

[Series 1: us ob detail+14 wk · 0.24mm/px · 12 of 83 slices shown]
[im 4/83]
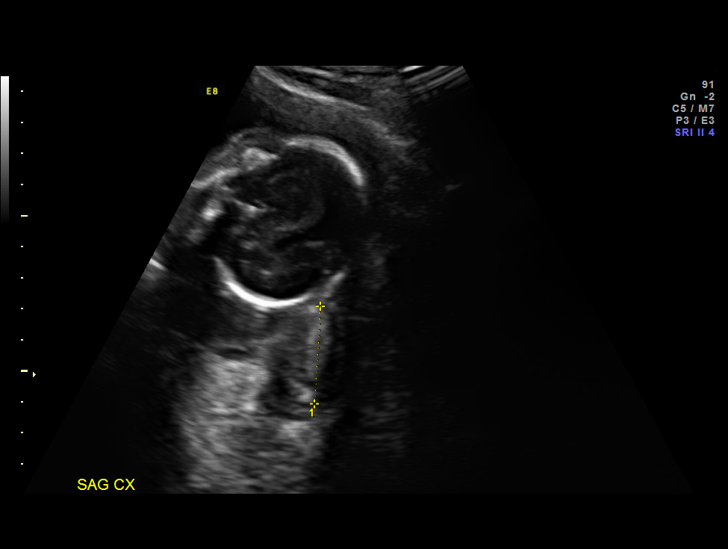
[im 10/83]
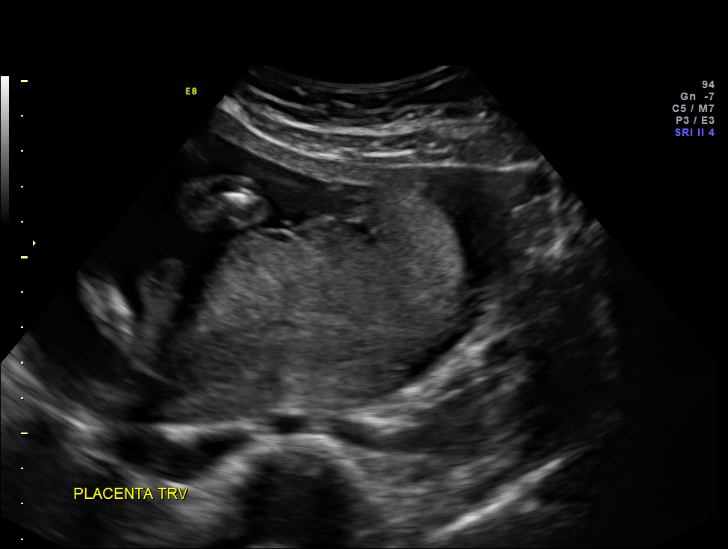
[im 16/83]
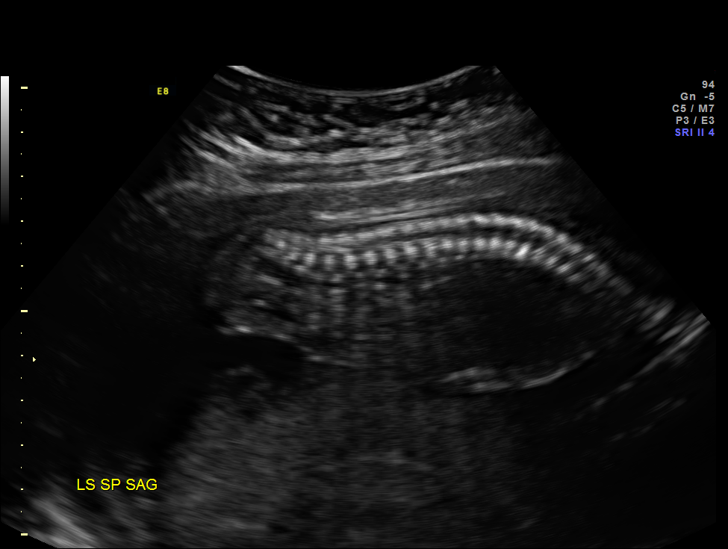
[im 25/83]
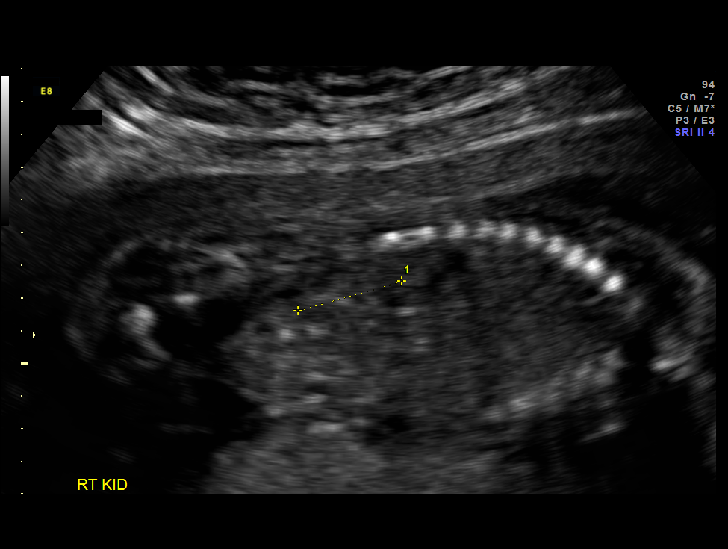
[im 31/83]
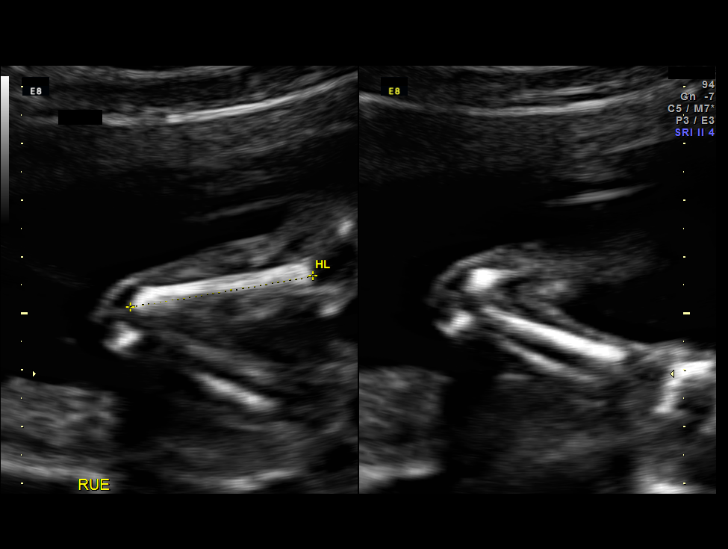
[im 37/83]
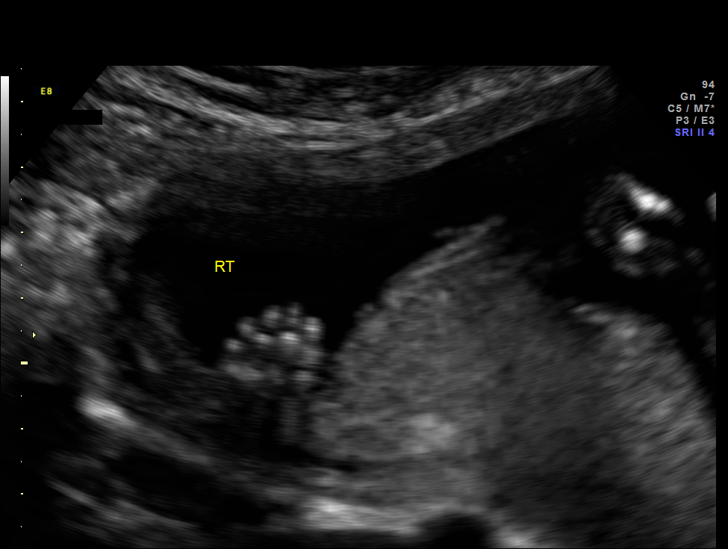
[im 46/83]
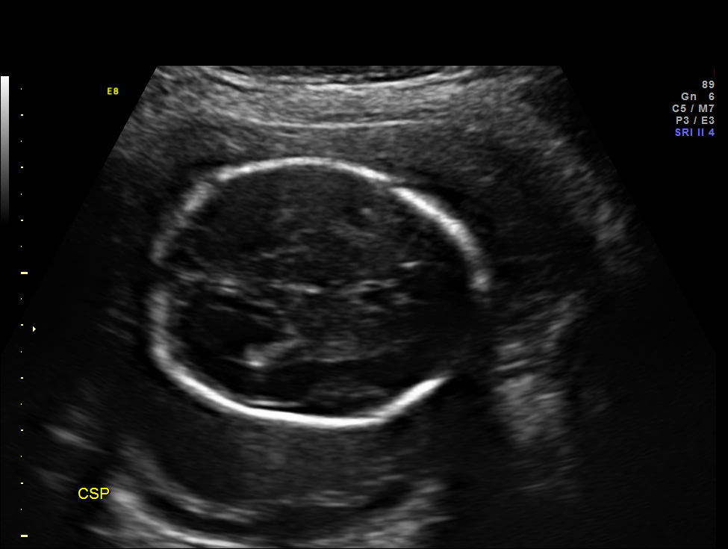
[im 52/83]
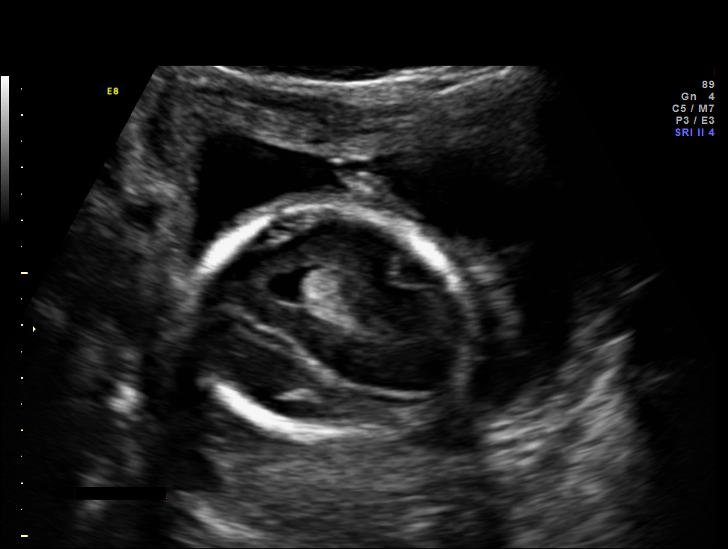
[im 58/83]
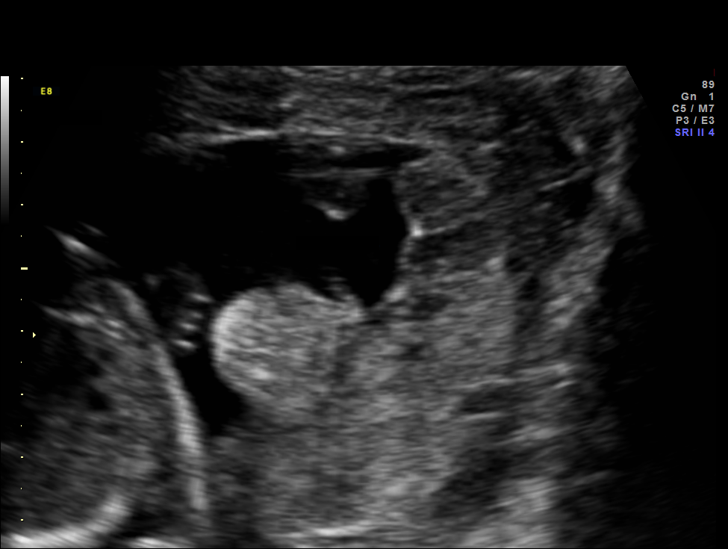
[im 67/83]
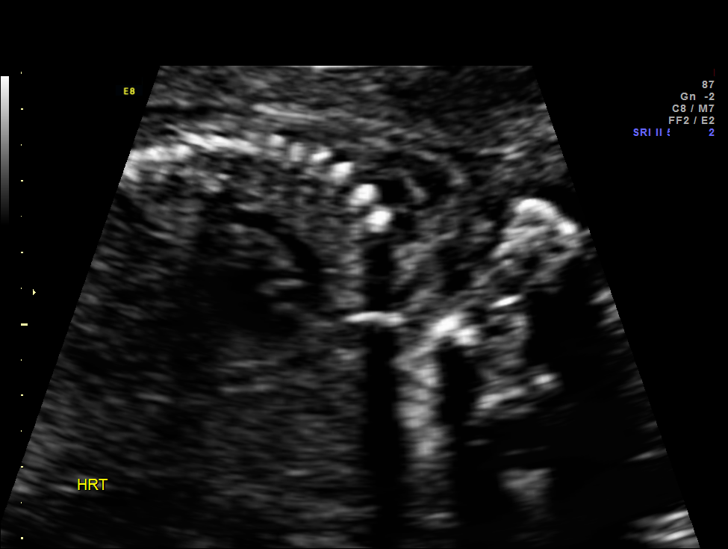
[im 73/83]
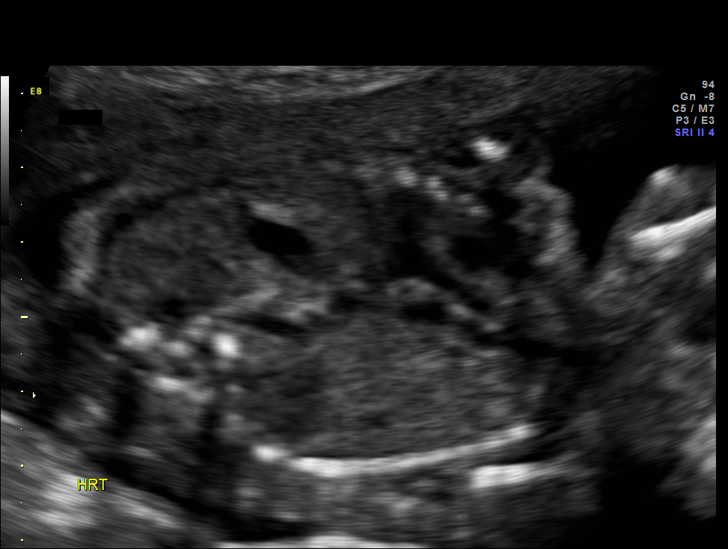
[im 79/83]
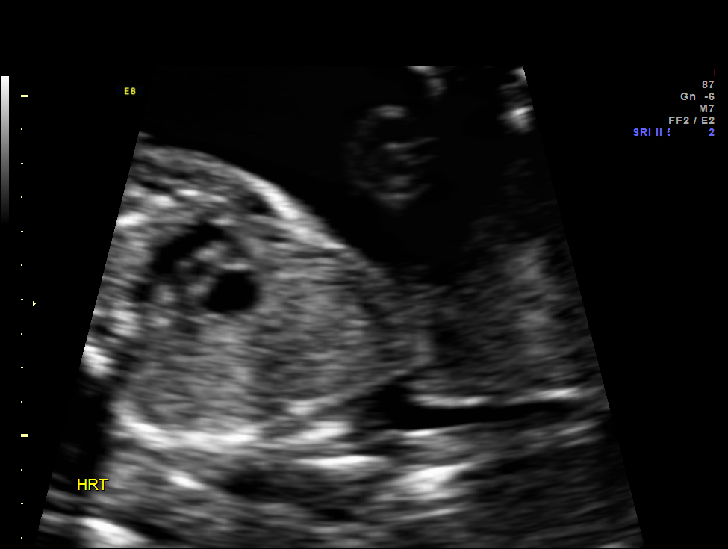

[12 of 28 positions shown; findings below may reference images not displayed]

OBSTETRICS REPORT
                      (Signed Final 09/07/2013 [DATE])

Service(s) Provided

 US OB DETAIL + 14 WK                                  76811.0
Indications

 Detailed fetal anatomic survey
 Advanced maternal age (38), Primigravida - low
 risk NIPS
 Advanced paternal age (52) Unspecified High Risk
 Cigarette smoker
 High risk pregnancy due to maternal drug abuse        648.40, 305.90,
 (cannabis)
Fetal Evaluation

 Num Of Fetuses:    1
 Fetal Heart Rate:  124                          bpm
 Cardiac Activity:  Observed
 Presentation:      Cephalic
 Placenta:          Posterior, above cervical
                    os
 P. Cord            Visualized, central
 Insertion:

 Amniotic Fluid
 AFI FV:      Subjectively within normal limits
                                             Larg Pckt:     5.3  cm
Biometry

 BPD:     49.3  mm     G. Age:  20w 6d                CI:         77.9   70 - 86
 OFD:     63.3  mm                                    FL/HC:      18.8   15.9 -

 HC:     181.2  mm     G. Age:  20w 4d       13  %    HC/AC:      1.23   1.06 -

 AC:     147.9  mm     G. Age:  20w 1d       11  %    FL/BPD:
 FL:      34.1  mm     G. Age:  20w 5d       23  %    FL/AC:      23.1   20 - 24
 HUM:       32  mm     G. Age:  20w 5d       32  %
 CER:     20.9  mm     G. Age:  19w 6d       17  %

 Est. FW:     351  gm    0 lb 12 oz      24  %
Gestational Age

 LMP:           20w 2d        Date:  04/18/13                 EDD:   01/23/14
 U/S Today:     20w 4d                                        EDD:   01/21/14
 Best:          21w 2d     Det. By:  Early Ultrasound         EDD:   01/16/14
                                     (06/11/13)
Anatomy

 Cranium:          Appears normal         Aortic Arch:      Appears normal
 Fetal Cavum:      Appears normal         Ductal Arch:      Appears normal
 Ventricles:       Appears normal         Diaphragm:        Appears normal
 Choroid Plexus:   Appears normal         Stomach:          Appears normal, left
                                                            sided
 Cerebellum:       Appears normal         Abdomen:          Appears normal
 Posterior Fossa:  Appears normal         Abdominal Wall:   Appears nml (cord
                                                            insert, abd wall)
 Nuchal Fold:      Not applicable (>20    Cord Vessels:     Appears normal (3
                   wks GA)                                  vessel cord)
 Face:             Appears normal         Kidneys:          Appear normal
                   (orbits and profile)
 Lips:             Appears normal         Bladder:          Appears normal
 Heart:            Appears normal         Spine:            Appears normal
                   (4CH, axis, and
                   situs)
 RVOT:             Appears normal         Lower             Appears normal
                                          Extremities:
 LVOT:             Appears normal         Upper             Appears normal
                                          Extremities:

 Other:  Fetus appears to be a female. Heels and 5th digit visualized. Nasal
         bone visualized.
Targeted Anatomy

 Fetal Central Nervous System
 Cisterna Magna:
Cervix Uterus Adnexa

 Cervical Length:    3.1      cm

 Cervix:       Normal appearance by transabdominal scan.
 Left Ovary:    Within normal limits.
 Right Ovary:   Within normal limits. Small corpus luteum noted.

 Adnexa:     No abnormality visualized.
Impression

 SIUP at 21+2 weeks
 Normal detailed fetal anatomy
 Normal amniotic fluid volume
 Measurements consistent with prior US
Recommendations

 Follow-up ultrasound for growth in 6 weeks (LIENAD, JULLON and
 tobacco use)

## 2015-07-15 ENCOUNTER — Emergency Department (HOSPITAL_COMMUNITY)
Admission: EM | Admit: 2015-07-15 | Discharge: 2015-07-15 | Disposition: A | Discharge status: Home / Self Care | Payer: Medicaid Other | Attending: Emergency Medicine | Admitting: Emergency Medicine

## 2015-07-15 DIAGNOSIS — K0381 Cracked tooth: Secondary | ICD-10-CM | POA: Insufficient documentation

## 2015-07-15 DIAGNOSIS — K0889 Other specified disorders of teeth and supporting structures: Secondary | ICD-10-CM | POA: Insufficient documentation

## 2015-07-15 DIAGNOSIS — Z79899 Other long term (current) drug therapy: Secondary | ICD-10-CM | POA: Insufficient documentation

## 2015-07-15 DIAGNOSIS — F17210 Nicotine dependence, cigarettes, uncomplicated: Secondary | ICD-10-CM | POA: Insufficient documentation

## 2015-07-15 DIAGNOSIS — K002 Abnormalities of size and form of teeth: Secondary | ICD-10-CM | POA: Insufficient documentation

## 2015-07-15 MED ORDER — PENICILLIN V POTASSIUM 500 MG PO TABS: 500.0000 mg | ORAL_TABLET | Freq: Three times a day (TID) | ORAL | Status: DC

## 2015-07-15 NOTE — ED Provider Notes (Signed)
CSN: [id number]     Arrival date & time 07/15/15  [id number] History  By signing my name below, I, Eustaquio Maize, attest that this documentation has been prepared under the direction and in the presence of [employer], PA-C.  Electronically Signed: Eustaquio Maize, ED Scribe. 07/15/2015. 9:57 AM.   No chief complaint on file.  The history is provided by the patient. No language interpreter was used.    HPI Comments: Teresa Cain is a 40 y.o. female who presents to the Emergency Department complaining of gradual onset, constant, left upper dental pain x 3 days. Pt has a fractured tooth in the same area that has been there for the past year. Pt does not currently have a dentist. She notes difficulty with eating due to the pain but is able to tolerate her own secretions. She has been taking 8 tablets of Tylenol in the past 8-12 hours without relief. Denies facial swelling, fever, chills, or any other associated symptoms. Pt is currently breastfeeding.    Past Medical History  Diagnosis Date  . No pertinent past medical history   . Head ache 2001    Spinal tap procedure was done to r/o meningitis   Past Surgical History  Procedure Laterality Date  . No past surgeries    . Cesarean section N/A 01/10/2014    Procedure: CESAREAN SECTION;  Surgeon: Osborne Oman, MD;  Location: [facility];  Service: Obstetrics;  Laterality: N/A;   Family History  Problem Relation Age of Onset  . Diabetes Mother   . Hypertension Mother   . Diabetes Maternal Grandmother    Social History  Substance Use Topics  . Smoking status: Current Every Day Smoker -- 0.25 packs/day    Types: Cigarettes  . Smokeless tobacco: Never Used  . Alcohol Use: No   OB History    Gravida Para Term Preterm AB TAB SAB Ectopic Multiple Living   1 1 1  0 0 0 0 0 0 1     Review of Systems  Constitutional: Negative for fever and chills.  HENT: Positive for dental problem. Negative for facial swelling.    Allergies  Shellfish  allergy  Home Medications   Prior to Admission medications   Medication Sig Start Date End Date Taking? Authorizing Provider  acetaminophen (TYLENOL) 500 MG tablet Take 500 mg by mouth every 6 (six) hours as needed.    Historical Provider, MD  ibuprofen (ADVIL,MOTRIN) 600 MG tablet Take 1 tablet (600 mg total) by mouth every 4 (four) hours as needed. Patient not taking: Reported on 02/20/2014 01/12/14   Virginia Crews, MD  oxyCODONE-acetaminophen (PERCOCET/ROXICET) 5-325 MG per tablet Take 1 tablet by mouth every 4 (four) hours as needed for moderate pain or severe pain. 01/12/14   Virginia Crews, MD  Prenatal Multivit-Min-Fe-FA (PRENATAL VITAMINS) 0.8 MG tablet Take 1 tablet by mouth daily. 06/11/13   Shari Upstill, PA-C   BP 125/90 mmHg  Pulse 96  Temp(Src) 98.5 F (36.9 C) (Oral)  Resp 18  Ht 5\' 2"  (1.575 m)  Wt 124 lb (56.246 kg)  BMI 22.67 kg/m2  SpO2 96%  Breastfeeding? Yes   Physical Exam  Constitutional: She is oriented to person, place, and time. She appears well-developed and well-nourished. No distress.  HENT:  Head: Normocephalic and atraumatic.  Mouth/Throat: Uvula is midline, oropharynx is clear and moist and mucous membranes are normal. Abnormal dentition.    Several avulsed teeth with swelling of the gums noted on right upper maxillary teeth.  No drainage, erythema, abscess, or bleeding   Eyes: Conjunctivae are normal. Pupils are equal, round, and reactive to light. Right eye exhibits no discharge. Left eye exhibits no discharge. No scleral icterus.  Neck: Normal range of motion.  Pulmonary/Chest: Effort normal.  Neurological: She is alert and oriented to person, place, and time.  Skin: Skin is warm and dry.  Psychiatric: She has a normal mood and affect.    ED Course  Procedures (including critical care time)  DIAGNOSTIC STUDIES: Oxygen Saturation is 96% on RA, normal by my interpretation.    COORDINATION OF CARE: 10:25 AM-Discussed treatment plan  which includes Rx antibiotics and pain medication with pt at bedside and pt agreed to plan.   MDM   Final diagnoses:  Tooth pain   40 year old with dentalgia.  No abscess requiring immediate incision and drainage.  Exam not concerning for Ludwig's angina or pharyngeal abscess.  Will treat with Penicillin. Dental block performed by A Harris PA-C (please refer to her note for procedure note). Pt instructed to follow-up with dentist.  Recommended Ibuprofen for pain. Discussed return precautions. Pt safe for discharge.  I personally performed the services described in this documentation, which was scribed in my presence. The recorded information has been reviewed and is accurate.      Recardo Evangelist, PA-C 07/15/15 1511  Orlie Dakin, MD 07/15/15 1719

## 2015-07-15 NOTE — ED Notes (Addendum)
Pt c/o L upper tooth pain onset x 2-3 days, pt reports having 3 broken teeth, pt denies seeing anyone for her symptoms, denies pain relief with OTC meds, pt reports breastfeeding currently, pt A&O x4. No facial swelling noted

## 2015-07-15 NOTE — Discharge Instructions (Signed)
Community Resource Guide Dental °The United Way’s “211” is a great source of information about community services available.  Access by dialing 2-1-1 from anywhere in [place], or by website -  www.nc211.org.  ° °Other Local Resources (Updated 02/2015) ° °Dental  Care °  °Services ° °  °Phone Number and Address  °Cost  °[facility] For children 0 - 40 years of age:  °• Cleaning °• Tooth brushing/flossing instruction °• Sealants, fillings, crowns °• Extractions °• Emergency treatment  [phone] °[address] °[place], [place] [address] Charges based on family income.  Medicaid and some insurance plans accepted.   °  °[facility] [place] • Cleaning °• Sealants, fillings, crowns °• Extractions °• Emergency treatment [phone] °[address] °[place], [place] ° Pregnant women 18 years of age or older with a Medicaid card  °[facility] [place] • Cleaning °• Sealants, fillings, crowns °• Extractions °• Emergency treatment [phone] °[address] °[place], [place] Pregnant women 18 years of age or older with a Medicaid card  °[facility] For children 0 - 40 years of age:  °• Cleaning °• Tooth brushing/flossing instruction °• Sealants, fillings, crowns °• Extractions °• Emergency treatment °Limited orthodontic services for patients with Medicaid [phone] °[address] °[place], [place] [address] Medicaid and [place] Health Choice cover for children up to age 40 and pregnant women.  Parents of children up to age 40 without Medicaid pay a reduced fee at time of service.  °[facility] [place] For children 0 - 40 years of age:  °• Cleaning °• Tooth brushing/flossing instruction °• Sealants, fillings, crowns °• Extractions °• Emergency treatment °Limited orthodontic services for patients with Medicaid  [phone] °[address] °[place], [place].  Medicaid and [place] Health Choice cover for children up to age 40 and pregnant women.  Parents of children up to age 40 without Medicaid pay a reduced fee.  °[facility] • Cleaning °• Sealants, fillings, crowns °• Extractions ° °Hours: Tuesdays and Thursdays, 4:15 - 8 pm [phone] °[address] °[place], [place] [address] Services free of charge to [place] residents ages 18-64 who do not have health insurance, Medicare, Medicaid, or VA benefits and fall within federal poverty guidelines  °[facility] ° ° ° Provides dental care in addition to primary medical care, nutritional counseling, and pharmacy: °• Cleaning °• Sealants, fillings, crowns °• Extractions ° ° ° ° ° ° ° ° ° ° ° ° ° ° ° ° ° [phone] °[facility], [address] °[place], [place] ° °[phone] °[facility], [address] [place], [place] ° °[phone] °[facility] °[place], [place] ° °[phone] °[facility], [address] °, [place] ° °[phone] °[facility] °[address] °[place], [place] Accepts Medicaid, Medicare, most insurance.  Also provides services available to all with fees adjusted based on ability to pay.    °[facility] • Cleaning °• Tooth brushing/flossing instruction °• Sealants, fillings, crowns °• Extractions °• Emergency treatment °Hours: Tuesdays, Thursdays, and Fridays from 8 am to 5 pm by appointment only. [phone] °[address] °[place], [place] [address] [place] residents with Medicaid (depending on eligibility) and children with [place] Health Choice - call for more information.  °  [facility]  Extractions only  Hours: 2nd and 4th Thursday of each month from 6:30 am - 9 am.   [phone] ext. [place], [place] [address] Ages 68 and older only.  Patients are seen on a first come, first served basis.  [employer]  [employer]  Extractions  Orthodontics  Endodontics  Implants/Crowns/Bridges  Complete and partial dentures [phone] [place], [place] Patients must complete an application for services.  There is often a waiting list.    [facility]  [address]  [place], [place] [address]  Phone [phone]  The [facility] in [place], [place], exemplifies the [employer] vision to improve the health and quality of life of all [place] by Regulatory affairs officer with a passion to care for the underserved and by leading the nation in community-based, service learning oral health education. We are committed to offering comprehensive general dental services for adults, children and special needs patients in a safe, caring and professional setting.  Appointments: Our clinic is open Monday through Friday 8:00 a.m. until 5:00 p.m. The amount of time scheduled for an appointment depends on the patients specific needs. We ask that you keep your appointed time for care or provide 24-hour notice of all appointment changes. Parents or legal guardians must accompany minor children.  Payment for Services: Medicaid and other insurance plans are welcome. Payment for services is due when services are rendered and may be made by cash or credit card. If you have dental insurance, we will assist you with your claim submission.   Emergencies: Emergency services will be provided Monday through Friday on a walk-in basis. Please arrive early for emergency services. After hours emergency services will be provided for patients of record as required.  Services:  Tax inspector Dentistry  Oral Surgery - Extractions  Root Canals  Sealants and Tooth Colored Fillings  Crowns and Bridges  Dentures and Partial Dentures  Implant Services  Periodontal Services and Cleanings  Cosmetic Statistician  3-D/[facility]

## 2015-07-15 NOTE — ED Provider Notes (Signed)
I performed a procedure on this patient and was otherwise not involved in the care or medical decision making of this patient.  Archie Endo Block Date/Time: 07/15/2015 11:06 AM Performed by: Margarita Mail Authorized by: Margarita Mail Consent: Verbal consent obtained. Risks and benefits: risks, benefits and alternatives were discussed Consent given by: patient Patient identity confirmed: provided demographic data Indications: pain relief Body area: face/mouth Nerve: infraorbital Laterality: left infraorbital and apical tooth block. Patient sedated: no Patient position: supine Needle gauge: 27 G Location technique: anatomical landmarks Local anesthetic: bupivacaine 0.5% with epinephrine Anesthetic total: 0.5 ml Outcome: pain improved Patient tolerance: Patient tolerated the procedure well with no immediate complications     Margarita Mail, PA-C 07/15/15 1108  Orlie Dakin, MD 07/15/15 1719

## 2015-07-15 NOTE — ED Notes (Signed)
C/o left upper dental pain x 3 days.

## 2018-01-23 ENCOUNTER — Telehealth (HOSPITAL_COMMUNITY): Payer: Self-pay | Admitting: Obstetrics and Gynecology

## 2018-01-23 NOTE — Telephone Encounter (Signed)
Called patient and left message regarding screening mammogram.

## 2019-07-06 ENCOUNTER — Other Ambulatory Visit: Payer: Self-pay

## 2019-07-06 DIAGNOSIS — N644 Mastodynia: Secondary | ICD-10-CM

## 2019-07-31 ENCOUNTER — Ambulatory Visit: Payer: Self-pay

## 2019-07-31 ENCOUNTER — Other Ambulatory Visit: Payer: Self-pay

## 2019-08-09 ENCOUNTER — Other Ambulatory Visit: Payer: Self-pay

## 2019-08-09 ENCOUNTER — Ambulatory Visit: Payer: Self-pay

## 2020-10-03 LAB — GLUCOSE, POCT (MANUAL RESULT ENTRY): POC Glucose: 148 mg/dl — AB (ref 70–99)

## 2021-01-27 ENCOUNTER — Other Ambulatory Visit: Payer: Self-pay | Admitting: Obstetrics and Gynecology

## 2021-01-27 DIAGNOSIS — Z1231 Encounter for screening mammogram for malignant neoplasm of breast: Secondary | ICD-10-CM

## 2021-03-12 ENCOUNTER — Other Ambulatory Visit: Payer: Self-pay

## 2021-03-12 ENCOUNTER — Ambulatory Visit: Payer: No Typology Code available for payment source

## 2021-03-12 ENCOUNTER — Ambulatory Visit: Payer: Self-pay | Admitting: *Deleted

## 2021-03-12 VITALS — BP 126/78 | Wt 161.6 lb

## 2021-03-12 DIAGNOSIS — Z1211 Encounter for screening for malignant neoplasm of colon: Secondary | ICD-10-CM

## 2021-03-12 DIAGNOSIS — Z1239 Encounter for other screening for malignant neoplasm of breast: Secondary | ICD-10-CM

## 2021-03-12 DIAGNOSIS — N644 Mastodynia: Secondary | ICD-10-CM

## 2021-03-12 NOTE — Patient Instructions (Signed)
Explained breast self awareness with Teresa Cain. Patient did not need a Pap smear today due to last Pap smear and HPV typing was 04/13/2019. Let her know BCCCP will cover Pap smears and HPV typing every 5 years unless has a history of abnormal Pap smears. Referred patient to the [facility] for a diagnostic mammogram. Appointment scheduled Tuesday, April 07, 2021 at 1240. Patient aware of appointment and will be there. Teresa Cain verbalized understanding.  Teresa Cain, Arvil Chaco, RN 9:20 AM

## 2021-03-12 NOTE — Progress Notes (Signed)
Ms. FINDLEY BLANKENBAKER is a 46 y.o. female who presents to [facility] today with complaint of right outer breast pain x one week that is constant. Patient rates the pain at a 7 out of 10.    Pap Smear: Pap smear not completed today. Last Pap smear was 04/13/2019 at [facility] and was normal with negative HPV. Per patient has no history of an abnormal Pap smear. Last Pap smear result is available in Epic.   Physical exam: Breasts Breasts symmetrical. No skin abnormalities bilateral breasts. No nipple retraction bilateral breasts. No nipple discharge bilateral breasts. No lymphadenopathy. No lumps palpated bilateral breasts. Complaints of right outer breast pain on exam.   Pelvic/Bimanual Pap is not indicated today per BCCCP guidelines.   Smoking History: Pap is a former smoker that quit in 2017.   Patient Navigation: Patient education provided. Access to services provided for patient through [facility] program.   Colorectal Cancer Screening: Per patient has never had colonoscopy completed. FIT Test given to patient to complete. No complaints today.    Breast and Cervical Cancer Risk Assessment: Patient has family history of her mother and maternal great grandmother having breast cancer. Patient has no known genetic mutations or history of radiation treatment to the chest before age 75. Patient does not have history of cervical dysplasia, immunocompromised, or DES exposure in-utero.  Risk Assessment     Risk Scores       03/12/2021   Last edited by: Demetrius Revel, LPN   5-year risk: 1.4 %   Lifetime risk: 14.4 %            A: BCCCP exam without pap smear Complaint of right outer breast pain.  P: Referred patient to the [facility] for a diagnostic mammogram. Appointment scheduled Tuesday, April 07, 2021 at 1240.  Loletta Parish, RN 03/12/2021 9:20 AM

## 2021-04-07 ENCOUNTER — Other Ambulatory Visit: Payer: Self-pay

## 2021-04-30 ENCOUNTER — Ambulatory Visit: Payer: Self-pay

## 2021-04-30 ENCOUNTER — Ambulatory Visit: Payer: No Typology Code available for payment source

## 2021-07-16 NOTE — Congregational Nurse Program (Signed)
Member seen to update.  She is progressing in GED program and has passed the reading exam.  She has 2 more tests to take, social studies and math.  She has also passed drivers license exam and obtained same.  She has a car to use and is trying to complete her GED and wants to find employment.  We discussed a cleaning company who may be hiring and she will call next week.   Vinnie Langton, RN  Congregational Nurse  [phone]

## 2021-10-21 NOTE — Congregational Nurse Program (Signed)
Brief encounter at [facility], no issues raised.  Glad to make contact with her again.  Vinnie Langton, RN, [phone]

## 2022-01-22 NOTE — Congregational Nurse Program (Signed)
Member seen at [facility] today.  Back in program for GED.  Smiling and pleasant.  Glad to be back.  Encouraged her attendance. Vinnie Langton, RN, [place] Nurse, [phone]

## 2022-02-14 NOTE — Congregational Nurse Program (Signed)
Member seen at [facility] today. Attending the graduation celebration.  Dressed nicely.  Helping with celebration.  Award given.   Will send her  number for cleaning service.  Vinnie Langton, RN, [place] Nurse, [phone]

## 2022-02-14 NOTE — Congregational Nurse Program (Signed)
Member seen at [facility] today.  Wants to find a job, has applied for several.  She could be eligible for new expanded Medicaid.  I will encourage her to apply.  Still glad to be back at [facility].  Will send her  number for cleaning service.  Vinnie Langton, RN, [place] Nurse, [phone]

## 2022-02-14 NOTE — Congregational Nurse Program (Signed)
Member seen at [facility] today. Attending the graduation celebration.  Dressed nicely.  Helping with celebration.  Award given.   Will send her  number for cleaning service.  Vinnie Langton, RN, [place] Nurse, [phone]

## 2022-03-15 NOTE — Congregational Nurse Program (Signed)
Member seen at [facility] today.  Wants to find a job, has applied for several.  She has been approved for full medicaid.  Took a mock interview.    Still glad to be back at [facility].  Will send her  number for cleaning service.  Vinnie Langton, RN, [place] Nurse, [phone]

## 2022-06-07 ENCOUNTER — Ambulatory Visit: Payer: Medicaid Other | Admitting: Family Medicine

## 2022-08-25 ENCOUNTER — Encounter: Payer: Self-pay | Admitting: Family

## 2022-08-25 ENCOUNTER — Ambulatory Visit (INDEPENDENT_AMBULATORY_CARE_PROVIDER_SITE_OTHER): Payer: Medicaid Other | Admitting: Family

## 2022-08-25 VITALS — BP 122/85 | HR 72 | Temp 98.7°F | Ht 61.0 in | Wt 150.4 lb

## 2022-08-25 DIAGNOSIS — Z131 Encounter for screening for diabetes mellitus: Secondary | ICD-10-CM

## 2022-08-25 DIAGNOSIS — R109 Unspecified abdominal pain: Secondary | ICD-10-CM | POA: Diagnosis not present

## 2022-08-25 DIAGNOSIS — I83813 Varicose veins of bilateral lower extremities with pain: Secondary | ICD-10-CM

## 2022-08-25 DIAGNOSIS — M25561 Pain in right knee: Secondary | ICD-10-CM | POA: Diagnosis not present

## 2022-08-25 DIAGNOSIS — G8929 Other chronic pain: Secondary | ICD-10-CM

## 2022-08-25 DIAGNOSIS — Z3202 Encounter for pregnancy test, result negative: Secondary | ICD-10-CM

## 2022-08-25 DIAGNOSIS — Z833 Family history of diabetes mellitus: Secondary | ICD-10-CM

## 2022-08-25 DIAGNOSIS — R21 Rash and other nonspecific skin eruption: Secondary | ICD-10-CM | POA: Diagnosis not present

## 2022-08-25 DIAGNOSIS — Z7689 Persons encountering health services in other specified circumstances: Secondary | ICD-10-CM

## 2022-08-25 DIAGNOSIS — Z8632 Personal history of gestational diabetes: Secondary | ICD-10-CM

## 2022-08-25 LAB — POCT URINE PREGNANCY: Preg Test, Ur: NEGATIVE

## 2022-08-25 MED ORDER — TRIAMCINOLONE ACETONIDE 0.025 % EX OINT: 1.0000 | TOPICAL_OINTMENT | Freq: Two times a day (BID) | CUTANEOUS | 0 refills | Status: DC

## 2022-08-25 NOTE — Progress Notes (Signed)
Subjective:    Teresa Cain - 47 y.o. female MRN [id number]  Date of birth: 07-15-1975  HPI  Teresa Cain is to establish care.   Current issues and/or concerns: - Chronic right knee pain. She denies recent trauma/injury and red flag symptoms. 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 Taking over-the-counter Excedrin with mild relief.  - Family history of diabetes. She does monitor what she eats. She does exercise.  - Intermittent rash of skin causing itching. She denies additional symptoms.  - Varicose veins of bilateral lower extremities causing intermittent pain. Denies additional symptoms. She would like to see a specialist.  - Intermittent right lower abdominal pain. She denies associated red flag symptoms.  - She is not breastfeeding.  - No further issues/concerns for discussion today.    ROS per HPI    Health Maintenance:  Health Maintenance Due  Topic Date Due   HEMOGLOBIN A1C  Never done   COVID-19 Vaccine (1) Never done   FOOT EXAM  Never done   OPHTHALMOLOGY EXAM  Never done   Diabetic kidney evaluation - Urine ACR  Never done   Hepatitis C Screening  Never done   Diabetic kidney evaluation - eGFR measurement  06/12/2014   PAP SMEAR-Modifier  08/01/2016   Colonoscopy  Never done     Past Medical History: Patient Active Problem List   Diagnosis Date Noted   S/P cesarean section 01/10/2014   Pregnancy and non-insulin-dependent diabetes mellitus 01/09/2014   [redacted] weeks gestation of pregnancy    GDM (gestational diabetes mellitus)    IUGR (intrauterine growth restriction) affecting care of mother    Group B Streptococcus carrier, +RV culture, currently pregnant 12/23/2013   Gestational diabetes mellitus in pregnancy 11/02/2013   Elderly primigravida, antepartum 08/08/2013   AMA (advanced maternal age) multigravida 35+ 08/01/2013   Supervision of high risk pregnancy, antepartum 08/01/2013   Uterine fibroids affecting  pregnancy 08/01/2013   Current smoker 08/01/2013     Social History   reports that she has been smoking cigarettes. She has been smoking an average of .25 packs per day. She has never used smokeless tobacco. She reports that she does not drink alcohol and does not use drugs.   Family History  family history includes Diabetes in her maternal grandmother and mother; Hypertension in her mother.   Medications: reviewed and updated   Objective:   Physical Exam BP 122/85   Pulse 72   Temp 98.7 F (37.1 C) (Oral)   Ht 5\' 1"  (1.549 m)   Wt 150 lb 6.4 oz (68.2 kg)   LMP 07/30/2022 (Approximate)   SpO2 98%   BMI 28.42 kg/m   Physical Exam HENT:     Head: Normocephalic and atraumatic.     Nose: Nose normal.     Mouth/Throat:     Mouth: Mucous membranes are moist.     Pharynx: Oropharynx is clear.  Eyes:     Extraocular Movements: Extraocular movements intact.     Conjunctiva/sclera: Conjunctivae normal.     Pupils: Pupils are equal, round, and reactive to light.  Cardiovascular:     Rate and Rhythm: Normal rate and regular rhythm.     Pulses: Normal pulses.     Heart sounds: Normal heart sounds.  Pulmonary:     Effort: Pulmonary effort is normal.     Breath sounds: Normal breath sounds.  Abdominal:     General: Bowel sounds are normal.  Palpations: Abdomen is soft.  Musculoskeletal:        General: Normal range of motion.     Right shoulder: Normal.     Left shoulder: Normal.     Right upper arm: Normal.     Left upper arm: Normal.     Right elbow: Normal.     Left elbow: Normal.     Right forearm: Normal.     Left forearm: Normal.     Right wrist: Normal.     Left wrist: Normal.     Right hand: Normal.     Left hand: Normal.     Cervical back: Normal, normal range of motion and neck supple.     Thoracic back: Normal.     Lumbar back: Normal.     Right hip: Normal.     Left hip: Normal.     Right upper leg: Normal.     Left upper leg: Normal.     Right  knee: Normal.     Left knee: Normal.     Right lower leg: Normal.     Left lower leg: Normal.     Right ankle: Normal.     Left ankle: Normal.     Right foot: Normal.     Left foot: Normal.     Comments: Bilateral lower extremities posterior knees with varicose veins.   Skin:    General: Skin is warm and dry.     Findings: Rash present.  Neurological:     General: No focal deficit present.     Mental Status: She is alert and oriented to person, place, and time.  Psychiatric:        Mood and Affect: Mood normal.        Behavior: Behavior normal.      Assessment & Plan:  1. Encounter to establish care - Patient presents today to establish care. During the interim follow-up with primary provider as scheduled.  - Return for annual physical examination, labs, and health maintenance. Arrive fasting meaning having no food for at least 8 hours prior to appointment. You may have only water or black coffee. Please take scheduled medications as normal.  2. Chronic pain of right knee - Patient declined pharmacological therapy.  - Referral to Orthopedics for further evaluation/management. During the interim follow-up with primary provider until established with referral.  - Ambulatory referral to Orthopedics  3. History of gestational diabetes 4. Diabetes mellitus screening 5. Family history of diabetes mellitus - Routine screening.  - Hemoglobin A1c - Microalbumin / creatinine urine ratio  6. Varicose veins of both lower extremities with pain - Referral to Vascular Surgery for further evaluation/management.  - Ambulatory referral to Vascular Surgery  7. Abdominal pain, unspecified abdominal location - Routine screening.  - CMP14+EGFR - Cervicovaginal ancillary only - POCT urine pregnancy - Amylase - Lipase  8. Rash and nonspecific skin eruption - Triamcinolone ointment as prescribed. Counseled on medication adherence/adverse effects. - Follow-up with primary provider in 4 weeks  or sooner if needed.  - triamcinolone (KENALOG) 0.025 % ointment; Apply 1 Application topically 2 (two) times daily.  Dispense: 60 g; Refill: 0    Patient was given clear instructions to go to Emergency Department or return to medical center if symptoms don't improve, worsen, or new problems develop.The patient verbalized understanding.  I discussed the assessment and treatment plan with the patient. The patient was provided an opportunity to ask questions and all were answered. The patient agreed with the plan  and demonstrated an understanding of the instructions.   The patient was advised to call back or seek an in-person evaluation if the symptoms worsen or if the condition fails to improve as anticipated.    Ricky Stabs, NP 08/25/2022, 10:04 AM Primary Care at [facility]

## 2022-08-25 NOTE — Progress Notes (Signed)
Pt has concerns of her knees popping,and inflammation in her knees. This has been happening for over a year.  Pt has skin irritations. Doesn't know if she has came in contact with anything.   Pt states diabetes runs in her family, and that she has lost family members to it.

## 2022-08-25 NOTE — Patient Instructions (Signed)
Thank you for choosing Primary Care at [facility] for your medical home!    Teresa Cain was seen by Rema Fendt, NP today.   Teresa Cain's primary care provider is Ricky Stabs, NP.   For the best care possible,  you should try to see Ricky Stabs, NP whenever you come to office.   We look forward to seeing you again soon!  If you have any questions about your visit today,  please call us at [phone]  Or feel free to reach your provider via MyChart.    Keeping you healthy   Get these tests Blood pressure- Have your blood pressure checked once a year by your healthcare provider.  Normal blood pressure is 120/80. Weight- Have your body mass index (BMI) calculated to screen for obesity.  BMI is a measure of body fat based on height and weight. You can also calculate your own BMI at https://www.west-esparza.com/. Cholesterol- Have your cholesterol checked regularly starting at age 23, sooner may be necessary if you have diabetes, high blood pressure, if a family member developed heart diseases at an early age or if you smoke.  Chlamydia, HIV, and other sexual transmitted disease- Get screened each year until the age of 58 then within three months of each new sexual partner. Diabetes- Have your blood sugar checked regularly if you have high blood pressure, high cholesterol, a family history of diabetes or if you are overweight.   Get these vaccines Flu shot- Every fall. Tetanus shot- Every 10 years. Menactra- Single dose; prevents meningitis.   Take these steps Don't smoke- If you do smoke, ask your healthcare provider about quitting. For tips on how to quit, go to www.smokefree.gov or call 1-800-QUIT-NOW. Be physically active- Exercise 5 days a week for at least 30 minutes.  If you are not already physically active start slow and gradually work up to 30 minutes of moderate physical activity.  Examples of moderate activity include walking briskly, mowing the yard,  dancing, swimming bicycling, etc. Eat a healthy diet- Eat a variety of healthy foods such as fruits, vegetables, low fat milk, low fat cheese, yogurt, lean meats, poultry, fish, beans, tofu, etc.  For more information on healthy eating, go to www.thenutritionsource.org Drink alcohol in moderation- Limit alcohol intake two drinks or less a day.  Never drink and drive. Dentist- Brush and floss teeth twice daily; visit your dentis twice a year. Depression-Your emotional health is as important as your physical health.  If you're feeling down, losing interest in things you normally enjoy please talk with your healthcare provider. Gun Safety- If you keep a gun in your home, keep it unloaded and with the safety lock on.  Bullets should be stored separately. Helmet use- Always wear a helmet when riding a motorcycle, bicycle, rollerblading or skateboarding. Safe sex- If you may be exposed to a sexually transmitted infection, use a condom Seat belts- Seat bels can save your life; always wear one. Smoke/Carbon Monoxide detectors- These detectors need to be installed on the appropriate level of your home.  Replace batteries at least once a year. Skin Cancer- When out in the sun, cover up and use sunscreen SPF 15 or higher. Violence- If anyone is threatening or hurting you, please tell your healthcare provider.

## 2022-08-26 ENCOUNTER — Other Ambulatory Visit: Payer: Self-pay | Admitting: Family

## 2022-08-26 DIAGNOSIS — R109 Unspecified abdominal pain: Secondary | ICD-10-CM

## 2022-08-26 LAB — HEMOGLOBIN A1C
Est. average glucose Bld gHb Est-mCnc: 120 mg/dL
Hgb A1c MFr Bld: 5.8 % — ABNORMAL HIGH (ref 4.8–5.6)

## 2022-08-26 LAB — CMP14+EGFR
ALT: 17 IU/L (ref 0–32)
AST: 16 IU/L (ref 0–40)
Albumin: 4.5 g/dL (ref 3.9–4.9)
Alkaline Phosphatase: 66 IU/L (ref 44–121)
BUN/Creatinine Ratio: 6 — ABNORMAL LOW (ref 9–23)
BUN: 5 mg/dL — ABNORMAL LOW (ref 6–24)
Bilirubin Total: 0.3 mg/dL (ref 0.0–1.2)
CO2: 22 mmol/L (ref 20–29)
Calcium: 9.6 mg/dL (ref 8.7–10.2)
Chloride: 104 mmol/L (ref 96–106)
Creatinine, Ser: 0.79 mg/dL (ref 0.57–1.00)
Globulin, Total: 2.8 g/dL (ref 1.5–4.5)
Glucose: 99 mg/dL (ref 70–99)
Potassium: 4.3 mmol/L (ref 3.5–5.2)
Sodium: 140 mmol/L (ref 134–144)
Total Protein: 7.3 g/dL (ref 6.0–8.5)
eGFR: 93 mL/min/{1.73_m2} (ref >59)

## 2022-08-26 LAB — MICROALBUMIN / CREATININE URINE RATIO
Creatinine, Urine: 359 mg/dL
Microalb/Creat Ratio: 6 mg/g creat (ref 0–29)
Microalbumin, Urine: 20.9 ug/mL

## 2022-08-26 LAB — AMYLASE: Amylase: 114 U/L — ABNORMAL HIGH (ref 31–110)

## 2022-08-26 LAB — LIPASE: Lipase: 40 U/L (ref 14–72)

## 2022-08-31 ENCOUNTER — Ambulatory Visit (INDEPENDENT_AMBULATORY_CARE_PROVIDER_SITE_OTHER): Payer: Medicaid Other | Admitting: Surgical

## 2022-08-31 ENCOUNTER — Other Ambulatory Visit (INDEPENDENT_AMBULATORY_CARE_PROVIDER_SITE_OTHER): Payer: Medicaid Other

## 2022-08-31 ENCOUNTER — Encounter: Payer: Self-pay | Admitting: Surgical

## 2022-08-31 DIAGNOSIS — M25561 Pain in right knee: Secondary | ICD-10-CM

## 2022-08-31 NOTE — Progress Notes (Signed)
Office Visit Note   Patient: Teresa Cain           Date of Birth: 1976-01-30           MRN: [id number] Visit Date: 08/31/2022 Requested by: Rema Fendt, NP [address] [place],  [place] [address] PCP: Georganna Skeans, MD  Subjective: Chief Complaint  Patient presents with   Right Knee - Pain    HPI: Teresa Cain is a 47 y.o. female who presents to the office reporting right knee pain.  Patient states that she has had chronic pain in her right knee for several years but has been worse and worse recently.  She states that she "tore a ligament" in her knee when she was in her 70s but this was only evaluated by exam by a provider with no MRI scan and no further treatment after this.  No history of prior surgery to the knee.  Localizes pain to the medial aspect of the knee.  Knee will occasionally give out on her.  She has on and off swelling in her right knee that has improved as she has cut out sugar from her diet to some extent.  She does have popping but no mechanical locking to her knee.  Knee will feel stiff at times.  She has never really had treatment for her knee pain as far as any injection or therapy.  She does try to do home exercises at home and has tried anti-inflammatories without any relief.  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  She also takes care of her young daughter.  No significant medical history aside from knee pain.  No diabetes.  She does smoke cigarettes..                ROS: All systems reviewed are negative as they relate to the chief complaint within the history of present illness.  Patient denies fevers or chills.  Assessment & Plan: Visit Diagnoses:  1. Right knee pain, unspecified chronicity     Plan: Patient is a 47 year old female who presents for evaluation of right knee pain.  Has had right knee  pain for years that has not improved with any conservative treatment such as a home exercise program and over-the-counter anti-inflammatories.  She has radiographs taken today demonstrating no significant degenerative changes or any acute osseous abnormality.  With negative radiographs, longstanding pain, medial joint line tenderness and positive McMurray's sign, I recommended obtaining MRI for further evaluation of medial meniscal tear.  She agreed with this plan.  Follow-up after MRI to review results.  Follow-Up Instructions: No follow-ups on file.   Orders:  Orders Placed This Encounter  Procedures   XR KNEE 3 VIEW RIGHT   MR Knee Right w/o contrast   No orders of the defined types were placed in this encounter.     Procedures: No procedures performed   Clinical Data: No additional findings.  Objective: Vital Signs: LMP 07/30/2022 (Approximate)   Physical Exam:  Constitutional: Patient appears well-developed HEENT:  Head: Normocephalic Eyes:EOM are normal Neck: Normal range of motion Cardiovascular: Normal rate Pulmonary/chest: Effort normal Neurologic: Patient is alert Skin: Skin is warm Psychiatric: Patient has normal mood and affect  Ortho Exam: Ortho exam demonstrates right knee with small effusion.  Tenderness over the medial joint line anteriorly  and posteriorly.  No tenderness over the lateral joint line.  Positive McMurray's sign reproducing medial joint line tenderness.  No calf tenderness.  Negative Homans' sign.  No pain with hip range of motion.  Negative FADIR sign.  She is able to perform straight leg raise without extensor lag.  Stable to varus and valgus stress at 0 and 30 degrees.  She has negative Lachman exam.  Negative anterior posterior drawer signs.  Specialty Comments:  No specialty comments available.  Imaging: No results found.   PMFS History: Patient Active Problem List   Diagnosis Date Noted   S/P cesarean section 01/10/2014   Pregnancy  and non-insulin-dependent diabetes mellitus 01/09/2014   [redacted] weeks gestation of pregnancy    GDM (gestational diabetes mellitus)    IUGR (intrauterine growth restriction) affecting care of mother    Group B Streptococcus carrier, +RV culture, currently pregnant 12/23/2013   Gestational diabetes mellitus in pregnancy 11/02/2013   Elderly primigravida, antepartum 08/08/2013   AMA (advanced maternal age) multigravida 35+ 08/01/2013   Supervision of high risk pregnancy, antepartum 08/01/2013   Uterine fibroids affecting pregnancy 08/01/2013   Current smoker 08/01/2013   Past Medical History:  Diagnosis Date   Head ache 2001   Spinal tap procedure was done to r/o meningitis   No pertinent past medical history     Family History  Problem Relation Age of Onset   Diabetes Mother    Hypertension Mother    Diabetes Maternal Grandmother     Past Surgical History:  Procedure Laterality Date   CESAREAN SECTION N/A 01/10/2014   Procedure: CESAREAN SECTION;  Surgeon: Tereso Newcomer, MD;  Location: [facility];  Service: Obstetrics;  Laterality: N/A;   NO PAST SURGERIES     Social History   Occupational History   Not on file  Tobacco Use   Smoking status: Every Day    Current packs/day: 0.00    Types: Cigarettes    Last attempt to quit: 2017    Years since quitting: 7.5   Smokeless tobacco: Never  Vaping Use   Vaping status: Never Used  Substance and Sexual Activity   Alcohol use: No   Drug use: Never   Sexual activity: Not Currently    Comment: plans Mirena or Nexplanon

## 2022-09-07 ENCOUNTER — Other Ambulatory Visit: Payer: Medicaid Other

## 2022-09-30 ENCOUNTER — Encounter: Payer: Medicaid Other | Admitting: Family Medicine

## 2022-10-28 ENCOUNTER — Encounter: Payer: Self-pay | Admitting: Family Medicine

## 2022-11-22 ENCOUNTER — Encounter: Payer: Medicaid Other | Admitting: Family Medicine

## 2022-12-09 ENCOUNTER — Ambulatory Visit: Payer: Medicaid Other | Admitting: Family Medicine

## 2022-12-09 ENCOUNTER — Encounter: Payer: Self-pay | Admitting: Family Medicine

## 2022-12-09 VITALS — BP 118/81 | HR 88 | Temp 98.2°F | Ht 61.5 in | Wt 154.2 lb

## 2022-12-09 DIAGNOSIS — M25551 Pain in right hip: Secondary | ICD-10-CM

## 2022-12-09 DIAGNOSIS — G5603 Carpal tunnel syndrome, bilateral upper limbs: Secondary | ICD-10-CM

## 2022-12-09 DIAGNOSIS — Z23 Encounter for immunization: Secondary | ICD-10-CM | POA: Diagnosis not present

## 2022-12-09 DIAGNOSIS — R21 Rash and other nonspecific skin eruption: Secondary | ICD-10-CM | POA: Diagnosis not present

## 2022-12-09 DIAGNOSIS — T63441A Toxic effect of venom of bees, accidental (unintentional), initial encounter: Secondary | ICD-10-CM

## 2022-12-09 MED ORDER — TRIAMCINOLONE ACETONIDE 0.025 % EX OINT: 1.0000 | TOPICAL_OINTMENT | Freq: Two times a day (BID) | CUTANEOUS | 0 refills | Status: DC

## 2022-12-09 MED ORDER — TRIAMCINOLONE ACETONIDE 40 MG/ML IJ SUSP
40.0000 mg | Freq: Once | INTRAMUSCULAR | Status: AC
  Administered 2022-12-09: 40 mg via INTRAMUSCULAR

## 2022-12-09 NOTE — Progress Notes (Signed)
Patient states she was stung a by a bee Sunday, and her who arm was swollen Monday, states swelling gone down but feels like something is there.   States pain in hip, she says it makes her needle to stand up fast.   Patient wants a Flu vaccine.

## 2022-12-09 NOTE — Progress Notes (Signed)
Patient given triamcinolone Acetonide 40 mg

## 2022-12-14 ENCOUNTER — Encounter: Payer: Self-pay | Admitting: Family Medicine

## 2022-12-14 NOTE — Progress Notes (Signed)
Established Patient Office Visit  Subjective    Patient ID: Teresa Cain, female    DOB: [birth year]  Age: 47 y.o. MRN: [id number]  CC:  Chief Complaint  Patient presents with   Annual Exam    HPI Teresa Cain presents for recent bee sting. She reports her arm is swollen but appears to be resolving slowly. Patient also reports that she has been she also reports some intermittent right hip pain and bilateral wrist pain. Lastly, she has reported intermittent flares of her eczema.   Outpatient Encounter Medications as of 12/09/2022  Medication Sig   [DISCONTINUED] triamcinolone (KENALOG) 0.025 % ointment Apply 1 Application topically 2 (two) times daily.   [DISCONTINUED] triamcinolone (KENALOG) 0.025 % ointment Apply 1 Application topically 2 (two) times daily.   triamcinolone (KENALOG) 0.025 % ointment Apply 1 Application topically 2 (two) times daily.   [DISCONTINUED] acetaminophen (TYLENOL) 500 MG tablet Take 500 mg by mouth every 6 (six) hours as needed. (Patient not taking: Reported on 08/25/2022)   [DISCONTINUED] penicillin v potassium (VEETID) 500 MG tablet Take 1 tablet (500 mg total) by mouth 3 (three) times daily. (Patient not taking: Reported on 08/25/2022)   [DISCONTINUED] Prenatal Multivit-Min-Fe-FA (PRENATAL VITAMINS) 0.8 MG tablet Take 1 tablet by mouth daily. (Patient not taking: Reported on 08/25/2022)   [EXPIRED] triamcinolone acetonide (KENALOG-40) injection 40 mg    No facility-administered encounter medications on file as of 12/09/2022.    Past Medical History:  Diagnosis Date   Head ache 2001   Spinal tap procedure was done to r/o meningitis   No pertinent past medical history     Past Surgical History:  Procedure Laterality Date   CESAREAN SECTION N/A 01/10/2014   Procedure: CESAREAN SECTION;  Surgeon: Tereso Newcomer, MD;  Location: [facility];  Service: Obstetrics;  Laterality: N/A;   NO PAST SURGERIES      Family History  Problem Relation Age of  Onset   Diabetes Mother    Hypertension Mother    Diabetes Maternal Grandmother     Social History   Socioeconomic History   Marital status: Single    Spouse name: Not on file   Number of children: 1   Years of education: Not on file   Highest education level: GED or equivalent  Occupational History   Not on file  Tobacco Use   Smoking status: Every Day    Current packs/day: 0.00    Types: Cigarettes    Last attempt to quit: 2017    Years since quitting: 7.8   Smokeless tobacco: Never  Vaping Use   Vaping status: Never Used  Substance and Sexual Activity   Alcohol use: No   Drug use: Never   Sexual activity: Not Currently    Comment: plans Mirena or Nexplanon  Other Topics Concern   Not on file  Social History Narrative   Not on file   Social Determinants of Health   Financial Resource Strain: Not on file  Food Insecurity: No Food Insecurity (03/12/2021)   Hunger Vital Sign    Worried About Running Out of Food in the Last Year: Never true    Ran Out of Food in the Last Year: Never true  Transportation Needs: No Transportation Needs (03/12/2021)   PRAPARE - Administrator, Civil Service (Medical): No    Lack of Transportation (Non-Medical): No  Physical Activity: Not on file  Stress: Not on file  Social Connections: Not on file  Intimate Partner  Violence: Not on file    Review of Systems  All other systems reviewed and are negative.       Objective    BP 118/81   Pulse 88   Temp 98.2 F (36.8 C) (Oral)   Ht 5' 1.5" (1.562 m)   Wt 154 lb 3.2 oz (69.9 kg)   SpO2 94%   BMI 28.66 kg/m   Physical Exam Vitals and nursing note reviewed.  Constitutional:      General: She is not in acute distress. Cardiovascular:     Rate and Rhythm: Normal rate and regular rhythm.  Pulmonary:     Effort: Pulmonary effort is normal.     Breath sounds: Normal breath sounds.  Abdominal:     Palpations: Abdomen is soft.     Tenderness: There is no  abdominal tenderness.  Neurological:     General: No focal deficit present.     Mental Status: She is alert and oriented to person, place, and time.         Assessment & Plan:  1. Bee sting, accidental or unintentional, initial encounter Improving. Continue with conservative measures.  2. Rash and nonspecific skin eruption Refill of meds. Skin care discussed.  - triamcinolone (KENALOG) 0.025 % ointment; Apply 1 Application topically 2 (two) times daily.  Dispense: 60 g; Refill: 0  3. Right hip pain Kenalog IM injection given.  - triamcinolone acetonide (KENALOG-40) injection 40 mg  4. Bilateral carpal tunnel syndrome As above - triamcinolone acetonide (KENALOG-40) injection 40 mg  5. Encounter for immunization  - Flu vaccine trivalent PF, 6mos and older(Flulaval,Afluria,Fluarix,Fluzone)    Return if symptoms worsen or fail to improve.   Tommie Raymond, MD

## 2023-05-26 ENCOUNTER — Other Ambulatory Visit: Payer: Self-pay | Admitting: Family Medicine

## 2023-05-26 DIAGNOSIS — R21 Rash and other nonspecific skin eruption: Secondary | ICD-10-CM

## 2023-05-26 NOTE — Telephone Encounter (Signed)
 Copied from CRM [id number]. Topic: Clinical - Medication Refill >> May 26, 2023 11:50 AM Marlow Baars wrote: Most Recent Primary Care Visit:  Provider: Georganna Skeans  Department: [facility]  Visit Type: PHYSICAL  Date: 12/09/2022  Medication: triamcinolone (KENALOG) 0.025 % ointment  Has the patient contacted their pharmacy? No   Is this the correct pharmacy for this prescription? Yes If no, delete pharmacy and type the correct one.  This is the patient's preferred pharmacy:    [facility] [address], [place] - [address] [place] [address] Phone: [phone] Fax: [phone]   Has the prescription been filled recently? No  Is the patient out of the medication? No  Has the patient been seen for an appointment in the last year OR does the patient have an upcoming appointment? Yes  Can we respond through MyChart? Yes  Please assist patient further

## 2023-05-27 NOTE — Telephone Encounter (Signed)
 Requested medication (s) are due for refill today - yes  Requested medication (s) are on the active medication list -yes  Future visit scheduled -yes  Last refill: 12/09/22 60g  Notes to clinic: non delegated Rx  Requested Prescriptions  Pending Prescriptions Disp Refills   triamcinolone (KENALOG) 0.025 % ointment 60 g 0    Sig: Apply 1 Application topically 2 (two) times daily.     Not Delegated - Dermatology:  Corticosteroids Failed - 05/27/2023  9:04 AM      Failed - This refill cannot be delegated      Passed - Valid encounter within last 12 months    Recent Outpatient Visits           5 months ago Bee sting, accidental or unintentional, initial encounter   [facility], MD   9 months ago Encounter to establish care   [facility], [place] J, NP                 Requested Prescriptions  Pending Prescriptions Disp Refills   triamcinolone (KENALOG) 0.025 % ointment 60 g 0    Sig: Apply 1 Application topically 2 (two) times daily.     Not Delegated - Dermatology:  Corticosteroids Failed - 05/27/2023  9:04 AM      Failed - This refill cannot be delegated      Passed - Valid encounter within last 12 months    Recent Outpatient Visits           5 months ago Bee sting, accidental or unintentional, initial encounter   [place] [facility], MD   9 months ago Encounter to establish care   [facility], Salomon Fick, NP

## 2023-06-02 MED ORDER — TRIAMCINOLONE ACETONIDE 0.025 % EX OINT: 1.0000 | TOPICAL_OINTMENT | Freq: Two times a day (BID) | CUTANEOUS | 0 refills | Status: DC

## 2023-06-06 ENCOUNTER — Telehealth: Payer: Self-pay

## 2023-06-06 NOTE — Telephone Encounter (Signed)
 Copied from CRM [id number]. Topic: Clinical - Prescription Issue >> Jun 06, 2023  3:27 PM Star East wrote: Reason for CRM: triamcinolone  (KENALOG ) 0.025 % ointment- pharmacy states they did not receive refill request

## 2023-06-07 NOTE — Telephone Encounter (Signed)
 [place] to refill

## 2023-06-14 NOTE — Telephone Encounter (Signed)
Sent PA to pharmacy 

## 2023-06-15 ENCOUNTER — Other Ambulatory Visit: Payer: Self-pay

## 2023-06-15 ENCOUNTER — Telehealth: Payer: Self-pay | Admitting: *Deleted

## 2023-06-15 NOTE — Telephone Encounter (Signed)
 triamcinolone  (KENALOG ) 0.025 % ointment is ready to be picked up at her pharmacy

## 2023-06-23 ENCOUNTER — Encounter (HOSPITAL_COMMUNITY): Payer: Self-pay

## 2023-06-30 ENCOUNTER — Encounter: Payer: Self-pay | Admitting: Family Medicine

## 2023-06-30 ENCOUNTER — Ambulatory Visit (INDEPENDENT_AMBULATORY_CARE_PROVIDER_SITE_OTHER): Admitting: Family Medicine

## 2023-06-30 VITALS — BP 152/93 | HR 94 | Wt 151.6 lb

## 2023-06-30 DIAGNOSIS — L989 Disorder of the skin and subcutaneous tissue, unspecified: Secondary | ICD-10-CM

## 2023-06-30 DIAGNOSIS — R030 Elevated blood-pressure reading, without diagnosis of hypertension: Secondary | ICD-10-CM

## 2023-06-30 NOTE — Progress Notes (Signed)
 Established Patient Office Visit  Subjective    Patient ID: Teresa Cain, female    DOB: 1975/03/09  Age: 48 y.o. MRN: [id number]  CC:  Chief Complaint  Patient presents with   Medical Management of Chronic Issues    Skin issue with moles - no pain wanting to know if oits cancerous     HPI Teresa Cain presents with complaint of multiple skin lesion for which she finds concerning.she reports that some have grown or changed recently.   Outpatient Encounter Medications as of 06/30/2023  Medication Sig   triamcinolone  (KENALOG ) 0.025 % ointment Apply 1 Application topically 2 (two) times daily.   No facility-administered encounter medications on file as of 06/30/2023.    Past Medical History:  Diagnosis Date   Head ache 2001   Spinal tap procedure was done to r/o meningitis   No pertinent past medical history     Past Surgical History:  Procedure Laterality Date   CESAREAN SECTION N/A 01/10/2014   Procedure: CESAREAN SECTION;  Surgeon: Julianne Octave, MD;  Location: [facility];  Service: Obstetrics;  Laterality: N/A;   NO PAST SURGERIES      Family History  Problem Relation Age of Onset   Diabetes Mother    Hypertension Mother    Diabetes Maternal Grandmother     Social History   Socioeconomic History   Marital status: Single    Spouse name: Not on file   Number of children: 1   Years of education: Not on file   Highest education level: GED or equivalent  Occupational History   Not on file  Tobacco Use   Smoking status: Every Day    Current packs/day: 0.00    Types: Cigarettes    Last attempt to quit: 2017    Years since quitting: 8.3   Smokeless tobacco: Never  Vaping Use   Vaping status: Never Used  Substance and Sexual Activity   Alcohol use: No   Drug use: Never   Sexual activity: Not Currently    Comment: plans Mirena  or Nexplanon  Other Topics Concern   Not on file  Social History Narrative   Not on file   Social Drivers of Health    Financial Resource Strain: Not on file  Food Insecurity: No Food Insecurity (06/30/2023)   Hunger Vital Sign    Worried About Running Out of Food in the Last Year: Never true    Ran Out of Food in the Last Year: Never true  Transportation Needs: No Transportation Needs (06/30/2023)   PRAPARE - Administrator, Civil Service (Medical): No    Lack of Transportation (Non-Medical): No  Physical Activity: Not on file  Stress: Not on file  Social Connections: Not on file  Intimate Partner Violence: Unknown (06/30/2023)   Humiliation, Afraid, Rape, and Kick questionnaire    Fear of Current or Ex-Partner: Not on file    Emotionally Abused: No    Physically Abused: No    Sexually Abused: Not on file    Review of Systems  All other systems reviewed and are negative.       Objective    BP (!) 152/93 (BP Location: Right Arm, Patient Position: Sitting)   Pulse 94   Wt 151 lb 9.6 oz (68.8 kg)   SpO2 96%   BMI 28.18 kg/m   Physical Exam Vitals and nursing note reviewed.  Constitutional:      General: She is not in acute distress. Cardiovascular:  Rate and Rhythm: Normal rate and regular rhythm.  Pulmonary:     Effort: Pulmonary effort is normal.     Breath sounds: Normal breath sounds.  Skin:    Findings: Lesion (multiple) present.  Neurological:     General: No focal deficit present.     Mental Status: She is alert and oriented to person, place, and time.         Assessment & Plan:   Skin lesions -     Ambulatory referral to Dermatology  Elevated blood pressure reading in office without diagnosis of hypertension     Return if symptoms worsen or fail to improve.   Arlo Lama, MD

## 2023-07-04 ENCOUNTER — Encounter: Payer: Self-pay | Admitting: Family Medicine

## 2024-01-19 ENCOUNTER — Other Ambulatory Visit: Payer: Self-pay | Admitting: Medical Genetics

## 2024-01-25 ENCOUNTER — Other Ambulatory Visit: Payer: Self-pay | Admitting: Family Medicine

## 2024-01-25 DIAGNOSIS — R21 Rash and other nonspecific skin eruption: Secondary | ICD-10-CM

## 2024-01-25 NOTE — Telephone Encounter (Unsigned)
 Copied from CRM #[id number]. Topic: Clinical - Medication Refill >> Jan 25, 2024  8:41 AM Rea ORN wrote: Medication: triamcinolone  (KENALOG ) 0.025 % ointment  Has the patient contacted their pharmacy? No (Agent: If no, request that the patient contact the pharmacy for the refill. If patient does not wish to contact the pharmacy document the reason why and proceed with request.) (Agent: If yes, when and what did the pharmacy advise?)  This is the patient's preferred pharmacy:  [facility] [address]  - [address] [place] [address] Phone: [phone] Fax: [phone]  Is this the correct pharmacy for this prescription? Yes If no, delete pharmacy and type the correct one.   Has the prescription been filled recently? No  Is the patient out of the medication? No  Has the patient been seen for an appointment in the last year OR does the patient have an upcoming appointment? Yes  Can we respond through MyChart? Yes  Agent: Please be advised that Rx refills may take up to 3 business days. We ask that you follow-up with your pharmacy.

## 2024-01-27 MED ORDER — TRIAMCINOLONE ACETONIDE 0.025 % EX OINT: 1.0000 | TOPICAL_OINTMENT | Freq: Two times a day (BID) | CUTANEOUS | 0 refills | Status: AC

## 2024-03-15 ENCOUNTER — Other Ambulatory Visit
